# Patient Record
Sex: Female | Born: 1975 | Hispanic: No | Marital: Married | Smoking: Current some day smoker
Health system: Southern US, Community
[De-identification: ages and names within clinical notes are randomized; demographics above are authoritative.]

## PROBLEM LIST (undated history)

## (undated) ENCOUNTER — Ambulatory Visit (HOSPITAL_COMMUNITY): Payer: Managed Care, Other (non HMO) | Source: Home / Self Care

## (undated) DIAGNOSIS — Z8619 Personal history of other infectious and parasitic diseases: Secondary | ICD-10-CM

## (undated) DIAGNOSIS — E282 Polycystic ovarian syndrome: Secondary | ICD-10-CM

## (undated) DIAGNOSIS — I10 Essential (primary) hypertension: Secondary | ICD-10-CM

## (undated) DIAGNOSIS — O09529 Supervision of elderly multigravida, unspecified trimester: Secondary | ICD-10-CM

## (undated) HISTORY — DX: Supervision of elderly multigravida, unspecified trimester: O09.529

## (undated) HISTORY — DX: Personal history of other infectious and parasitic diseases: Z86.19

---

## 2004-12-07 ENCOUNTER — Emergency Department (HOSPITAL_COMMUNITY): Admission: EM | Admit: 2004-12-07 | Discharge: 2004-12-07 | Payer: Self-pay | Admitting: Emergency Medicine

## 2005-05-17 ENCOUNTER — Other Ambulatory Visit: Admission: RE | Admit: 2005-05-17 | Discharge: 2005-05-17 | Payer: Self-pay | Admitting: Family Medicine

## 2005-08-07 ENCOUNTER — Ambulatory Visit (HOSPITAL_COMMUNITY): Admission: RE | Admit: 2005-08-07 | Discharge: 2005-08-07 | Payer: Self-pay | Admitting: Obstetrics and Gynecology

## 2006-07-15 ENCOUNTER — Emergency Department (HOSPITAL_COMMUNITY): Admission: EM | Admit: 2006-07-15 | Discharge: 2006-07-15 | Payer: Self-pay | Admitting: Emergency Medicine

## 2006-07-18 ENCOUNTER — Emergency Department (HOSPITAL_COMMUNITY): Admission: EM | Admit: 2006-07-18 | Discharge: 2006-07-18 | Payer: Self-pay | Admitting: Emergency Medicine

## 2008-01-06 ENCOUNTER — Emergency Department (HOSPITAL_COMMUNITY): Admission: EM | Admit: 2008-01-06 | Discharge: 2008-01-06 | Payer: Self-pay | Admitting: Emergency Medicine

## 2009-02-08 ENCOUNTER — Ambulatory Visit (HOSPITAL_COMMUNITY): Admission: RE | Admit: 2009-02-08 | Discharge: 2009-02-08 | Payer: Self-pay | Admitting: Gynecology

## 2009-09-07 ENCOUNTER — Emergency Department (HOSPITAL_COMMUNITY): Admission: EM | Admit: 2009-09-07 | Discharge: 2009-09-08 | Payer: Self-pay | Admitting: Emergency Medicine

## 2009-11-30 ENCOUNTER — Encounter: Admission: RE | Admit: 2009-11-30 | Discharge: 2010-01-06 | Payer: Self-pay | Admitting: Obstetrics and Gynecology

## 2010-01-31 ENCOUNTER — Ambulatory Visit: Payer: Self-pay | Admitting: Family

## 2010-01-31 ENCOUNTER — Inpatient Hospital Stay (HOSPITAL_COMMUNITY): Admission: AD | Admit: 2010-01-31 | Discharge: 2010-01-31 | Payer: Self-pay | Admitting: Obstetrics and Gynecology

## 2010-02-06 ENCOUNTER — Inpatient Hospital Stay (HOSPITAL_COMMUNITY): Admission: AD | Admit: 2010-02-06 | Discharge: 2010-02-08 | Payer: Self-pay | Admitting: Obstetrics and Gynecology

## 2010-02-08 ENCOUNTER — Encounter: Admission: RE | Admit: 2010-02-08 | Discharge: 2010-02-17 | Payer: Self-pay | Admitting: Obstetrics and Gynecology

## 2010-06-20 LAB — CBC
MCV: 88.4 fL (ref 78.0–100.0)
WBC: 16.9 10*3/uL — ABNORMAL HIGH (ref 4.0–10.5)

## 2010-06-21 LAB — CBC
Hemoglobin: 12.3 g/dL (ref 12.0–15.0)
Hemoglobin: 12.5 g/dL (ref 12.0–15.0)
MCHC: 33.7 g/dL (ref 30.0–36.0)
MCHC: 34.2 g/dL (ref 30.0–36.0)
MCHC: 34.4 g/dL (ref 30.0–36.0)
Platelets: 228 10*3/uL (ref 150–400)
RBC: 4.08 MIL/uL (ref 3.87–5.11)
RDW: 15 % (ref 11.5–15.5)
WBC: 9.3 10*3/uL (ref 4.0–10.5)

## 2010-06-21 LAB — COMPREHENSIVE METABOLIC PANEL
ALT: 18 U/L (ref 0–35)
AST: 19 U/L (ref 0–37)
Alkaline Phosphatase: 96 U/L (ref 39–117)
Alkaline Phosphatase: 98 U/L (ref 39–117)
BUN: 10 mg/dL (ref 6–23)
CO2: 22 mEq/L (ref 19–32)
CO2: 23 mEq/L (ref 19–32)
Calcium: 8.9 mg/dL (ref 8.4–10.5)
Calcium: 8.9 mg/dL (ref 8.4–10.5)
Chloride: 103 mEq/L (ref 96–112)
GFR calc Af Amer: >60 mL/min (ref >60)
Glucose, Bld: 104 mg/dL — ABNORMAL HIGH (ref 70–99)
Potassium: 3.9 mEq/L (ref 3.5–5.1)
Potassium: 4 mEq/L (ref 3.5–5.1)
Sodium: 133 mEq/L — ABNORMAL LOW (ref 135–145)
Total Bilirubin: 0.8 mg/dL (ref 0.3–1.2)
Total Bilirubin: 0.9 mg/dL (ref 0.3–1.2)

## 2010-06-21 LAB — URINALYSIS, ROUTINE W REFLEX MICROSCOPIC
Bilirubin Urine: NEGATIVE
Glucose, UA: NEGATIVE mg/dL
Hgb urine dipstick: NEGATIVE
Ketones, ur: NEGATIVE mg/dL
Protein, ur: NEGATIVE mg/dL
Specific Gravity, Urine: 1.025 (ref 1.005–1.030)
pH: 6 (ref 5.0–8.0)

## 2010-06-21 LAB — GLUCOSE, CAPILLARY: Glucose-Capillary: 76 mg/dL (ref 70–99)

## 2010-06-21 LAB — URIC ACID
Uric Acid, Serum: 5.8 mg/dL (ref 2.4–7.0)
Uric Acid, Serum: 6.1 mg/dL (ref 2.4–7.0)

## 2010-06-26 LAB — POCT PREGNANCY, URINE: Preg Test, Ur: POSITIVE

## 2010-06-26 LAB — URINALYSIS, ROUTINE W REFLEX MICROSCOPIC
Glucose, UA: NEGATIVE mg/dL
Hgb urine dipstick: NEGATIVE
Ketones, ur: 15 mg/dL — AB
Leukocytes, UA: NEGATIVE
Protein, ur: 100 mg/dL — AB
Specific Gravity, Urine: 1.034 — ABNORMAL HIGH (ref 1.005–1.030)
Urobilinogen, UA: 0.2 mg/dL (ref 0.0–1.0)
pH: 6 (ref 5.0–8.0)

## 2010-06-26 LAB — URINE MICROSCOPIC-ADD ON

## 2010-06-26 LAB — CBC
HCT: 34.5 % — ABNORMAL LOW (ref 36.0–46.0)
Hemoglobin: 12.1 g/dL (ref 12.0–15.0)
MCV: 87.9 fL (ref 78.0–100.0)
Platelets: 273 10*3/uL (ref 150–400)
RBC: 3.93 MIL/uL (ref 3.87–5.11)
RDW: 13.4 % (ref 11.5–15.5)
WBC: 7.4 10*3/uL (ref 4.0–10.5)

## 2010-06-26 LAB — BASIC METABOLIC PANEL
GFR calc Af Amer: >60 mL/min (ref >60)
Potassium: 3 mEq/L — ABNORMAL LOW (ref 3.5–5.1)
Sodium: 136 mEq/L (ref 135–145)

## 2010-06-26 LAB — DIFFERENTIAL
Basophils Relative: 0 % (ref 0–1)
Eosinophils Absolute: 0.1 10*3/uL (ref 0.0–0.7)
Monocytes Absolute: 0.4 10*3/uL (ref 0.1–1.0)
Neutrophils Relative %: 77 % (ref 43–77)

## 2010-06-26 LAB — WET PREP, GENITAL
Trich, Wet Prep: NONE SEEN
WBC, Wet Prep HPF POC: NONE SEEN
Yeast Wet Prep HPF POC: NONE SEEN

## 2011-02-20 LAB — OB RESULTS CONSOLE GC/CHLAMYDIA: Chlamydia: NEGATIVE

## 2011-02-20 LAB — OB RESULTS CONSOLE RPR: RPR: NONREACTIVE

## 2011-02-20 LAB — OB RESULTS CONSOLE RUBELLA ANTIBODY, IGM: Rubella: IMMUNE

## 2011-02-20 LAB — OB RESULTS CONSOLE HEPATITIS B SURFACE ANTIGEN: Hepatitis B Surface Ag: NEGATIVE

## 2011-03-28 ENCOUNTER — Encounter: Payer: Self-pay | Admitting: Emergency Medicine

## 2011-03-28 ENCOUNTER — Emergency Department (HOSPITAL_COMMUNITY): Payer: Managed Care, Other (non HMO)

## 2011-03-28 ENCOUNTER — Emergency Department (HOSPITAL_COMMUNITY)
Admission: EM | Admit: 2011-03-28 | Discharge: 2011-03-28 | Disposition: A | Discharge status: Home / Self Care | Payer: Managed Care, Other (non HMO) | Attending: Emergency Medicine | Admitting: Emergency Medicine

## 2011-03-28 DIAGNOSIS — R509 Fever, unspecified: Secondary | ICD-10-CM | POA: Insufficient documentation

## 2011-03-28 DIAGNOSIS — J111 Influenza due to unidentified influenza virus with other respiratory manifestations: Secondary | ICD-10-CM | POA: Insufficient documentation

## 2011-03-28 MED ORDER — ACETAMINOPHEN 325 MG PO TABS
650.0000 mg | ORAL_TABLET | Freq: Once | ORAL | Status: AC
  Administered 2011-03-28: 650 mg via ORAL
  Filled 2011-03-28: qty 2

## 2011-03-28 MED ORDER — OSELTAMIVIR PHOSPHATE 75 MG PO CAPS: 75.0000 mg | ORAL_CAPSULE | Freq: Two times a day (BID) | ORAL | Status: AC

## 2011-03-28 NOTE — ED Notes (Signed)
Alert, NAD, calm, interactive, registration at [facility], xray here to take pt, to xray iin w/c, mentions: HA, some sob, some CP, dizziness & nausea.

## 2011-03-28 NOTE — ED Provider Notes (Signed)
History    35yF with fever and general malaise. Gradual onset about 3d ago. Progressively worsening. Feels achy all over. Mild diffuse HA. No neck stiffness. Subjective fever. No urinary complaints. Occasional productive cough. No sob. Vomiting x2. nbnb emesis. Sick contacts.   CSN: [id number] Arrival date & time: 03/28/2011  2:24 AM   First MD Initiated Contact with Patient 03/28/11 [phone]      Chief Complaint  Patient presents with  . Generalized Body Aches    (Consider location/radiation/quality/duration/timing/severity/associated sxs/prior treatment) HPI  History reviewed. No pertinent past medical history.  History reviewed. No pertinent past surgical history.  History reviewed. No pertinent family history.  History  Substance Use Topics  . Smoking status: Never Smoker   . Smokeless tobacco: Not on file  . Alcohol Use: No    OB History    Grav Para Term Preterm Abortions TAB SAB Ect Mult Living                  Review of Systems   Review of symptoms negative unless otherwise noted in HPI.  Allergies  Review of patient's allergies indicates no known allergies.  Home Medications   Current Outpatient Rx  Name Route Sig Dispense Refill  . PRENATAL 27-0.8 MG PO TABS Oral Take 1 tablet by mouth daily.        BP 112/67  Pulse 121  Temp(Src) 100.1 F (37.8 C) (Oral)  Resp 19  SpO2 95%  LMP 12/24/2010  Physical Exam  Nursing note and vitals reviewed. Constitutional: She appears well-developed and well-nourished.       mildly uncomfortable appearing but not toxic  HENT:  Head: Normocephalic and atraumatic.  Right Ear: External ear normal.  Left Ear: External ear normal.  Mouth/Throat: Oropharynx is clear and moist. No oropharyngeal exudate.  Eyes: Conjunctivae are normal. Pupils are equal, round, and reactive to light. Right eye exhibits no discharge. Left eye exhibits no discharge.  Neck: Normal range of motion. Neck supple.  Cardiovascular: Regular  rhythm and normal heart sounds.  Exam reveals no gallop and no friction rub.   No murmur heard.      mildy tachycardic  Pulmonary/Chest: Effort normal and breath sounds normal. No respiratory distress. She has no wheezes. She has no rales.  Abdominal: Soft. She exhibits no distension. There is no tenderness.  Genitourinary:       No cva tenderness.  Musculoskeletal: She exhibits no edema and no tenderness.  Lymphadenopathy:    She has no cervical adenopathy.  Neurological: She is alert.  Skin: Skin is warm and dry. She is not diaphoretic.  Psychiatric: She has a normal mood and affect. Her behavior is normal. Thought content normal.    ED Course  Procedures (including critical care time)  Labs Reviewed - No data to display No results found.   1. Fever   2. Influenza       MDM  35yF with fever and cough. Consider viral uri, pneumonia, pe. Suspect viral illness, likely flu. Pt nontoxic and no respiratory distress. CXR with no focal infiltrate. Pt otherwise healthy. Symptom onset less than 24 hours ago and also pregnant so will tx with tamiflu. Strict return precautions given. outpt fu.        Raeford Razor, MD 04/05/11 [phone]

## 2011-03-28 NOTE — ED Notes (Signed)
Sx onset 2-3d ago, family sick with same sx.

## 2011-03-28 NOTE — ED Notes (Signed)
EDP in to see pt at d/cgiven Rx x1.

## 2011-03-28 NOTE — ED Notes (Signed)
PT. REPORTS GENERALIZED BODY ACHES  WITH HEADACHE , PRODUCTIVE COUGH AND VOMITTING ONSET YESTERDAY.

## 2011-09-24 ENCOUNTER — Telehealth (HOSPITAL_COMMUNITY): Payer: Self-pay | Admitting: *Deleted

## 2011-09-24 ENCOUNTER — Encounter (HOSPITAL_COMMUNITY): Payer: Self-pay | Admitting: *Deleted

## 2011-09-24 NOTE — Telephone Encounter (Signed)
Preadmission screen  

## 2011-09-27 ENCOUNTER — Inpatient Hospital Stay (HOSPITAL_COMMUNITY)
Admission: RE | Admit: 2011-09-27 | Discharge: 2011-09-29 | DRG: 775 | Disposition: A | Discharge status: Home / Self Care | Payer: Managed Care, Other (non HMO) | Source: Ambulatory Visit | Attending: Obstetrics and Gynecology | Admitting: Obstetrics and Gynecology

## 2011-09-27 ENCOUNTER — Encounter (HOSPITAL_COMMUNITY): Payer: Self-pay | Admitting: Anesthesiology

## 2011-09-27 ENCOUNTER — Inpatient Hospital Stay (HOSPITAL_COMMUNITY): Payer: Managed Care, Other (non HMO) | Admitting: Anesthesiology

## 2011-09-27 ENCOUNTER — Encounter (HOSPITAL_COMMUNITY): Payer: Self-pay

## 2011-09-27 VITALS — BP 113/82 | HR 73 | Temp 97.7°F | Resp 18 | Ht 66.0 in | Wt 242.0 lb

## 2011-09-27 DIAGNOSIS — Z3490 Encounter for supervision of normal pregnancy, unspecified, unspecified trimester: Secondary | ICD-10-CM

## 2011-09-27 DIAGNOSIS — O09529 Supervision of elderly multigravida, unspecified trimester: Secondary | ICD-10-CM | POA: Diagnosis present

## 2011-09-27 LAB — OB RESULTS CONSOLE GBS: GBS: NEGATIVE

## 2011-09-27 LAB — CBC
Hemoglobin: 11.7 g/dL — ABNORMAL LOW (ref 12.0–15.0)
MCHC: 35.2 g/dL (ref 30.0–36.0)
RDW: 15 % (ref 11.5–15.5)

## 2011-09-27 LAB — RPR: RPR Ser Ql: NONREACTIVE

## 2011-09-27 MED ORDER — CITRIC ACID-SODIUM CITRATE 334-500 MG/5ML PO SOLN: 30.0000 mL | ORAL | Status: DC | PRN

## 2011-09-27 MED ORDER — FLEET ENEMA 7-19 GM/118ML RE ENEM: 1.0000 | ENEMA | RECTAL | Status: DC | PRN

## 2011-09-27 MED ORDER — LACTATED RINGERS IV SOLN: 500.0000 mL | INTRAVENOUS | Status: DC | PRN

## 2011-09-27 MED ORDER — ACETAMINOPHEN 325 MG PO TABS: 650.0000 mg | ORAL_TABLET | ORAL | Status: DC | PRN

## 2011-09-27 MED ORDER — IBUPROFEN 600 MG PO TABS
600.0000 mg | ORAL_TABLET | Freq: Four times a day (QID) | ORAL | Status: DC
  Administered 2011-09-27 – 2011-09-29 (×7): 600 mg via ORAL
  Filled 2011-09-27 (×7): qty 1

## 2011-09-27 MED ORDER — LANOLIN HYDROUS EX OINT: TOPICAL_OINTMENT | CUTANEOUS | Status: DC | PRN

## 2011-09-27 MED ORDER — DIPHENHYDRAMINE HCL 25 MG PO CAPS: 25.0000 mg | ORAL_CAPSULE | Freq: Four times a day (QID) | ORAL | Status: DC | PRN

## 2011-09-27 MED ORDER — PRENATAL MULTIVITAMIN CH
1.0000 | ORAL_TABLET | Freq: Every day | ORAL | Status: DC
  Administered 2011-09-28 – 2011-09-29 (×2): 1 via ORAL
  Filled 2011-09-27 (×2): qty 1

## 2011-09-27 MED ORDER — BISACODYL 10 MG RE SUPP: 10.0000 mg | Freq: Every day | RECTAL | Status: DC | PRN

## 2011-09-27 MED ORDER — LIDOCAINE HCL (PF) 1 % IJ SOLN
INTRAMUSCULAR | Status: DC | PRN
  Administered 2011-09-27 (×3): 4 mL

## 2011-09-27 MED ORDER — OXYTOCIN 40 UNITS IN LACTATED RINGERS INFUSION - SIMPLE MED: 62.5000 mL/h | Freq: Once | INTRAVENOUS | Status: DC

## 2011-09-27 MED ORDER — LACTATED RINGERS IV SOLN
INTRAVENOUS | Status: DC
  Administered 2011-09-27 (×3): via INTRAVENOUS

## 2011-09-27 MED ORDER — OXYTOCIN 40 UNITS IN LACTATED RINGERS INFUSION - SIMPLE MED
1.0000 m[IU]/min | INTRAVENOUS | Status: DC
  Administered 2011-09-27: 1 m[IU]/min via INTRAVENOUS
  Filled 2011-09-27: qty 1000

## 2011-09-27 MED ORDER — DIPHENHYDRAMINE HCL 50 MG/ML IJ SOLN: 12.5000 mg | INTRAMUSCULAR | Status: DC | PRN

## 2011-09-27 MED ORDER — ZOLPIDEM TARTRATE 5 MG PO TABS: 5.0000 mg | ORAL_TABLET | Freq: Every evening | ORAL | Status: DC | PRN

## 2011-09-27 MED ORDER — IBUPROFEN 600 MG PO TABS: 600.0000 mg | ORAL_TABLET | Freq: Four times a day (QID) | ORAL | Status: DC | PRN

## 2011-09-27 MED ORDER — OXYCODONE-ACETAMINOPHEN 5-325 MG PO TABS: 1.0000 | ORAL_TABLET | ORAL | Status: DC | PRN

## 2011-09-27 MED ORDER — FLEET ENEMA 7-19 GM/118ML RE ENEM: 1.0000 | ENEMA | Freq: Every day | RECTAL | Status: DC | PRN

## 2011-09-27 MED ORDER — SIMETHICONE 80 MG PO CHEW: 80.0000 mg | CHEWABLE_TABLET | ORAL | Status: DC | PRN

## 2011-09-27 MED ORDER — ONDANSETRON HCL 4 MG/2ML IJ SOLN: 4.0000 mg | Freq: Four times a day (QID) | INTRAMUSCULAR | Status: DC | PRN

## 2011-09-27 MED ORDER — EPHEDRINE 5 MG/ML INJ
10.0000 mg | INTRAVENOUS | Status: DC | PRN
  Filled 2011-09-27: qty 4

## 2011-09-27 MED ORDER — FENTANYL 2.5 MCG/ML BUPIVACAINE 1/10 % EPIDURAL INFUSION (WH - ANES)
14.0000 mL/h | INTRAMUSCULAR | Status: DC
  Administered 2011-09-27: 14 mL/h via EPIDURAL
  Filled 2011-09-27: qty 60

## 2011-09-27 MED ORDER — LACTATED RINGERS IV SOLN: 500.0000 mL | Freq: Once | INTRAVENOUS | Status: DC

## 2011-09-27 MED ORDER — ONDANSETRON HCL 4 MG/2ML IJ SOLN: 4.0000 mg | INTRAMUSCULAR | Status: DC | PRN

## 2011-09-27 MED ORDER — ONDANSETRON HCL 4 MG PO TABS: 4.0000 mg | ORAL_TABLET | ORAL | Status: DC | PRN

## 2011-09-27 MED ORDER — PHENYLEPHRINE 40 MCG/ML (10ML) SYRINGE FOR IV PUSH (FOR BLOOD PRESSURE SUPPORT): 80.0000 ug | PREFILLED_SYRINGE | INTRAVENOUS | Status: DC | PRN

## 2011-09-27 MED ORDER — TETANUS-DIPHTH-ACELL PERTUSSIS 5-2.5-18.5 LF-MCG/0.5 IM SUSP
0.5000 mL | Freq: Once | INTRAMUSCULAR | Status: DC
  Filled 2011-09-27: qty 0.5

## 2011-09-27 MED ORDER — DIBUCAINE 1 % RE OINT: 1.0000 "application " | TOPICAL_OINTMENT | RECTAL | Status: DC | PRN

## 2011-09-27 MED ORDER — TERBUTALINE SULFATE 1 MG/ML IJ SOLN: 0.2500 mg | Freq: Once | INTRAMUSCULAR | Status: DC | PRN

## 2011-09-27 MED ORDER — OXYTOCIN BOLUS FROM INFUSION
250.0000 mL | Freq: Once | INTRAVENOUS | Status: DC
  Filled 2011-09-27: qty 500

## 2011-09-27 MED ORDER — PHENYLEPHRINE 40 MCG/ML (10ML) SYRINGE FOR IV PUSH (FOR BLOOD PRESSURE SUPPORT)
80.0000 ug | PREFILLED_SYRINGE | INTRAVENOUS | Status: DC | PRN
  Filled 2011-09-27: qty 5

## 2011-09-27 MED ORDER — WITCH HAZEL-GLYCERIN EX PADS: 1.0000 "application " | MEDICATED_PAD | CUTANEOUS | Status: DC | PRN

## 2011-09-27 MED ORDER — SENNOSIDES-DOCUSATE SODIUM 8.6-50 MG PO TABS
2.0000 | ORAL_TABLET | Freq: Every day | ORAL | Status: DC
  Administered 2011-09-27: 2 via ORAL

## 2011-09-27 MED ORDER — OXYCODONE-ACETAMINOPHEN 5-325 MG PO TABS
1.0000 | ORAL_TABLET | ORAL | Status: DC | PRN
  Administered 2011-09-28 – 2011-09-29 (×5): 1 via ORAL
  Filled 2011-09-27 (×5): qty 1

## 2011-09-27 MED ORDER — LIDOCAINE HCL (PF) 1 % IJ SOLN
30.0000 mL | INTRAMUSCULAR | Status: DC | PRN
  Filled 2011-09-27: qty 30

## 2011-09-27 MED ORDER — EPHEDRINE 5 MG/ML INJ: 10.0000 mg | INTRAVENOUS | Status: DC | PRN

## 2011-09-27 MED ORDER — BENZOCAINE-MENTHOL 20-0.5 % EX AERO
1.0000 "application " | INHALATION_SPRAY | CUTANEOUS | Status: DC | PRN
  Administered 2011-09-27: 1 via TOPICAL
  Filled 2011-09-27: qty 56

## 2011-09-27 NOTE — Progress Notes (Signed)
Delivery Note Rapid second stage Shoulder dystocia-McRobert's, second degree MLE, suprapubic pressure, less than 1 minute, nuchal cord x 1 reduced, mod traction VMI  Apgars 8/9, weight 8# 15 oz, art pH pending Placenta intact to path, 3 vessels EBL  350cc Patient/infant stable in LDR

## 2011-09-27 NOTE — H&P (Signed)
Rebecca Moody is a 36 y.o. female presenting for induction of labor at term with favorable cervix. No headache, epigastric pain or blurry vision. No ROM/Bleeding. Maternal Medical History:  Fetal activity: Perceived fetal activity is normal.      OB History    Grav Para Term Preterm Abortions TAB SAB Ect Mult Living   3 1 1  1  1   1      Past Medical History  Diagnosis Date  . H/O varicella   . AMA (advanced maternal age) multigravida 35+    No past surgical history on file. Family History: family history includes Diabetes in her father. Social History:  reports that she has never smoked. She has never used smokeless tobacco. She reports that she does not drink alcohol or use illicit drugs.   Prenatal Transfer Tool  Maternal Diabetes: No Genetic Screening: Normal Maternal Ultrasounds/Referrals: Normal Fetal Ultrasounds or other Referrals:  None Maternal Substance Abuse:  No Significant Maternal Medications:  None Significant Maternal Lab Results:  None Other Comments:  None  Review of Systems  Eyes: Negative for blurred vision.  Gastrointestinal: Negative for abdominal pain.  Neurological: Negative for headaches.      Temperature 97.9 F (36.6 C), temperature source Oral, resp. rate 18, last menstrual period 12/24/2010. Maternal Exam:  Uterine Assessment: Contraction strength is moderate.  Contraction frequency is irregular.   Abdomen: Fetal presentation: vertex     Physical Exam  Cardiovascular: Normal rate and regular rhythm.   Respiratory: Effort normal and breath sounds normal.  GI: There is no tenderness.  Neurological: She has normal reflexes.   Cx-4/80/-2/vertex AROM  Thin mec FHT reactive UCs irregular Prenatal labs: ABO, Rh: O/Positive/-- (11/13 0000) Antibody: Negative (11/13 0000) Rubella: Immune (11/13 0000) RPR: Nonreactive (11/13 0000)  HBsAg: Negative (11/13 0000)  HIV: Non-reactive (11/13 0000)  GBS:     Assessment/Plan: 36 yo G3P1  at term with favorable cervix. Induction and risks reviewed with patient.   [name],Foday Cone E 09/27/2011, 7:31 AM

## 2011-09-27 NOTE — Progress Notes (Signed)
GBBS negative

## 2011-09-27 NOTE — Anesthesia Preprocedure Evaluation (Signed)
Anesthesia Evaluation  Patient identified by MRN, date of birth, ID band Patient awake    Reviewed: Allergy & Precautions, H&P , NPO status , Patient's Chart, lab work & pertinent test results, reviewed documented beta blocker date and time   History of Anesthesia Complications Negative for: history of anesthetic complications  Airway Mallampati: I TM Distance: >3 FB Neck ROM: full    Dental  (+) Teeth Intact   Pulmonary neg pulmonary ROS,  breath sounds clear to auscultation        Cardiovascular negative cardio ROS  Rhythm:regular Rate:Normal     Neuro/Psych negative neurological ROS  negative psych ROS   GI/Hepatic negative GI ROS, Neg liver ROS,   Endo/Other  Morbid obesity  Renal/GU negative Renal ROS     Musculoskeletal   Abdominal   Peds  Hematology negative hematology ROS (+)   Anesthesia Other Findings   Reproductive/Obstetrics (+) Pregnancy                           Anesthesia Physical Anesthesia Plan  ASA: II  Anesthesia Plan: Epidural   Post-op Pain Management:    Induction:   Airway Management Planned:   Additional Equipment:   Intra-op Plan:   Post-operative Plan:   Informed Consent: I have reviewed the patients History and Physical, chart, labs and discussed the procedure including the risks, benefits and alternatives for the proposed anesthesia with the patient or authorized representative who has indicated his/her understanding and acceptance.     Plan Discussed with:   Anesthesia Plan Comments:         Anesthesia Quick Evaluation

## 2011-09-27 NOTE — Anesthesia Procedure Notes (Signed)
Epidural Patient location during procedure: OB Start time: 09/27/2011 11:13 AM Reason for block: procedure for pain  Staffing Performed by: anesthesiologist   Preanesthetic Checklist Completed: patient identified, site marked, surgical consent, pre-op evaluation, timeout performed, IV checked, risks and benefits discussed and monitors and equipment checked  Epidural Patient position: sitting Prep: site prepped and draped and DuraPrep Patient monitoring: continuous pulse ox and blood pressure Approach: midline Injection technique: LOR air  Needle:  Needle type: Tuohy  Needle gauge: 17 G Needle length: 9 cm Needle insertion depth: 6 cm Catheter type: closed end flexible Catheter size: 19 Gauge Catheter at skin depth: 11 cm Test dose: negative  Assessment Events: blood not aspirated, injection not painful, no injection resistance, negative IV test and no paresthesia  Additional Notes Discussed risk of headache, infection, bleeding, nerve injury and failed or incomplete block.  Patient voices understanding and wishes to proceed.

## 2011-09-27 NOTE — Progress Notes (Signed)
Cx 5-6/C/-2 FHT reactive UCs q2-3 min IUPC placed

## 2011-09-28 LAB — CBC
Hemoglobin: 9.9 g/dL — ABNORMAL LOW (ref 12.0–15.0)
MCH: 28.5 pg (ref 26.0–34.0)
RBC: 3.47 MIL/uL — ABNORMAL LOW (ref 3.87–5.11)

## 2011-09-28 NOTE — Anesthesia Postprocedure Evaluation (Signed)
  Anesthesia Post-op Note  Patient: Rebecca Moody  Procedure(s) Performed: * No procedures listed *  Patient Location: Mother/Baby  Anesthesia Type: Epidural  Level of Consciousness: awake  Airway and Oxygen Therapy: Patient Spontanous Breathing  Post-op Pain: none  Post-op Assessment: Patient's Cardiovascular Status Stable, Respiratory Function Stable, Patent Airway, No signs of Nausea or vomiting, Adequate PO intake, Pain level controlled, No headache, No backache, No residual numbness and No residual motor weakness  Post-op Vital Signs: Reviewed and stable  Complications: No apparent anesthesia complications

## 2011-09-28 NOTE — Anesthesia Postprocedure Evaluation (Signed)
  Anesthesia Post-op Note  Patient: Rebecca Moody  Procedure(s) Performed: * No procedures listed *  Patient Location: Mother/Baby  Anesthesia Type: Epidural  Level of Consciousness: awake  Airway and Oxygen Therapy: Patient Spontanous Breathing  Post-op Pain: none  Post-op Assessment: Patient's Cardiovascular Status Stable, Respiratory Function Stable, Patent Airway, No signs of Nausea or vomiting, Adequate PO intake, Pain level controlled, No headache, No backache, No residual numbness and No residual motor weakness  Post-op Vital Signs: Reviewed and stable  Complications: No apparent anesthesia complications 

## 2011-09-28 NOTE — Progress Notes (Signed)
Post Partum Day 1 Subjective: no complaints, up ad lib, voiding and tolerating PO. Desires circ  Objective: Blood pressure 105/70, pulse 94, temperature 98.3 F (36.8 C), temperature source Oral, resp. rate 18, height 5\' 6"  (1.676 m), weight 109.77 kg (242 lb), last menstrual period 12/24/2010, SpO2 100.00%, unknown if currently breastfeeding.  Physical Exam:  General: alert and cooperative Lochia: appropriate Uterine Fundus: firm Incision: perineum intact DVT Evaluation: No evidence of DVT seen on physical exam.   Basename 09/28/11 0525 09/27/11 0741  HGB 9.9* 11.7*  HCT 28.5* 33.2*    Assessment/Plan: Plan for discharge tomorrow   LOS: 1 day   Tachina Spoonemore G 09/28/2011, 8:16 AM

## 2011-09-29 MED ORDER — OXYCODONE-ACETAMINOPHEN 5-325 MG PO TABS: 1.0000 | ORAL_TABLET | ORAL | Status: AC | PRN

## 2011-09-29 MED ORDER — IBUPROFEN 600 MG PO TABS: 600.0000 mg | ORAL_TABLET | Freq: Four times a day (QID) | ORAL | Status: AC

## 2011-09-29 NOTE — Discharge Summary (Signed)
Obstetric Discharge Summary Reason for Admission: induction of labor Prenatal Procedures: none Intrapartum Procedures: spontaneous vaginal delivery Postpartum Procedures: none Complications-Operative and Postpartum: none Hemoglobin  Date Value Range Status  09/28/2011 9.9* 12.0 - 15.0 g/dL Final     HCT  Date Value Range Status  09/28/2011 28.5* 36.0 - 46.0 % Final    Physical Exam:  General: alert, cooperative and appears stated age 36: appropriate Uterine Fundus: firm Incision: healing well DVT Evaluation: No evidence of DVT seen on physical exam.  Discharge Diagnoses: Term Pregnancy-delivered  Discharge Information: Date: 09/29/2011 Activity: pelvic rest Diet: routine Medications: Ibuprofen and Percocet Condition: stable Instructions: refer to practice specific booklet Discharge to: home   Newborn Data: Live born female  Birth Weight: 8 lb 15 oz (4054 g) APGAR: 8, 9  Home with mother.  Yaneisy Wenz L 09/29/2011, 9:07 AM

## 2011-09-29 NOTE — Progress Notes (Signed)
Post Partum Day 2 Subjective: no complaints, up ad lib, voiding, tolerating PO and + flatus  Objective: Blood pressure 113/82, pulse 73, temperature 97.7 F (36.5 C), temperature source Oral, resp. rate 18, height 5\' 6"  (1.676 m), weight 109.77 kg (242 lb), last menstrual period 12/24/2010, SpO2 100.00%, unknown if currently breastfeeding.  Physical Exam:  General: alert, cooperative and appears stated age Lochia: appropriate Uterine Fundus: firm Incision: healing well, no significant drainage, no dehiscence, no significant erythema DVT Evaluation: No evidence of DVT seen on physical exam.   Basename 09/28/11 0525 09/27/11 0741  HGB 9.9* 11.7*  HCT 28.5* 33.2*    Assessment/Plan: Discharge home Ibuprofen and percocet Follow up in 6 weeks   LOS: 2 days   Hawk Mones L 09/29/2011, 9:06 AM

## 2013-07-17 ENCOUNTER — Emergency Department (INDEPENDENT_AMBULATORY_CARE_PROVIDER_SITE_OTHER): Payer: Managed Care, Other (non HMO)

## 2013-07-17 ENCOUNTER — Emergency Department (INDEPENDENT_AMBULATORY_CARE_PROVIDER_SITE_OTHER)
Admission: EM | Admit: 2013-07-17 | Discharge: 2013-07-17 | Disposition: A | Discharge status: Home / Self Care | Payer: Managed Care, Other (non HMO) | Source: Home / Self Care

## 2013-07-17 ENCOUNTER — Encounter (HOSPITAL_COMMUNITY): Payer: Self-pay | Admitting: Emergency Medicine

## 2013-07-17 DIAGNOSIS — X58XXXA Exposure to other specified factors, initial encounter: Secondary | ICD-10-CM

## 2013-07-17 DIAGNOSIS — S39012A Strain of muscle, fascia and tendon of lower back, initial encounter: Secondary | ICD-10-CM

## 2013-07-17 DIAGNOSIS — S335XXA Sprain of ligaments of lumbar spine, initial encounter: Secondary | ICD-10-CM

## 2013-07-17 LAB — POCT URINALYSIS DIP (DEVICE)
Bilirubin Urine: NEGATIVE
Glucose, UA: NEGATIVE mg/dL
Ketones, ur: NEGATIVE mg/dL
LEUKOCYTES UA: NEGATIVE
NITRITE: NEGATIVE
PH: 6 (ref 5.0–8.0)
PROTEIN: NEGATIVE mg/dL
Specific Gravity, Urine: >=1.03 (ref 1.005–1.030)
Urobilinogen, UA: 0.2 mg/dL (ref 0.0–1.0)

## 2013-07-17 LAB — POCT PREGNANCY, URINE: PREG TEST UR: NEGATIVE

## 2013-07-17 MED ORDER — CYCLOBENZAPRINE HCL 5 MG PO TABS: 5.0000 mg | ORAL_TABLET | Freq: Three times a day (TID) | ORAL | Status: DC | PRN

## 2013-07-17 MED ORDER — IBUPROFEN 800 MG PO TABS: 800.0000 mg | ORAL_TABLET | Freq: Three times a day (TID) | ORAL | Status: DC | PRN

## 2013-07-17 NOTE — ED Notes (Signed)
Pt c/o back pain and lower abd pain onset yest Pain is getting worse... Increases w/activity Denies urinary sxs, gyn sxs, f/v/n/d, inj/trauma States that yest she felt nauseas, dizzy and felt like "passing out" Denies syncope Alert w/no signs of acute distress.

## 2013-07-17 NOTE — Discharge Instructions (Signed)
Heat, medications and avoid prolonged sitting or bending over weekend, see orthopedist if further problems.

## 2013-07-17 NOTE — ED Provider Notes (Signed)
CSN: [id number]     Arrival date & time 07/17/13  0913 History   None    Chief Complaint  Patient presents with  . Back Pain   (Consider location/radiation/quality/duration/timing/severity/associated sxs/prior Treatment) Patient is a 38 y.o. female presenting with back pain. The history is provided by the patient.  Back Pain Location:  Lumbar spine Quality:  Shooting and stabbing Radiates to:  Does not radiate Pain severity:  Moderate Onset quality:  Gradual Duration:  1 day Progression:  Worsening Chronicity:  New Context comment:  Does sitting all day on job at [employer]. Worsened by:  Sitting and bending Associated symptoms: pelvic pain   Associated symptoms: no abdominal pain, no bladder incontinence, no bowel incontinence, no dysuria, no fever, no numbness, no paresthesias, no perianal numbness and no tingling   Associated symptoms comment:  Similar to pains during end of preg. Risk factors: obesity   Risk factors: no lack of exercise and not pregnant     Past Medical History  Diagnosis Date  . H/O varicella   . AMA (advanced maternal age) multigravida 35+    History reviewed. No pertinent past surgical history. Family History  Problem Relation Age of Onset  . Diabetes Father    History  Substance Use Topics  . Smoking status: Never Smoker   . Smokeless tobacco: Never Used  . Alcohol Use: No   OB History   Grav Para Term Preterm Abortions TAB SAB Ect Mult Living   3 2 2  0 1 0 1 0 0 2     Review of Systems  Constitutional: Negative for fever.  Gastrointestinal: Negative.  Negative for abdominal pain and bowel incontinence.  Genitourinary: Positive for pelvic pain. Negative for bladder incontinence and dysuria.  Musculoskeletal: Positive for back pain. Negative for gait problem and joint swelling.  Neurological: Negative for tingling, numbness and paresthesias.    Allergies  Review of patient's allergies indicates no known allergies.  Home Medications    Current Outpatient Rx  Name  Route  Sig  Dispense  Refill  . cyclobenzaprine (FLEXERIL) 5 MG tablet   Oral   Take 1 tablet (5 mg total) by mouth 3 (three) times daily as needed for muscle spasms.   30 tablet   0   . ibuprofen (ADVIL,MOTRIN) 800 MG tablet   Oral   Take 1 tablet (800 mg total) by mouth every 8 (eight) hours as needed. For back pain   30 tablet   0   . Prenatal Vit-Fe Fumarate-FA (PRENATAL MULTIVITAMIN) TABS   Oral   Take 1 tablet by mouth at bedtime.          BP 161/98  Pulse 72  Temp(Src) 98 F (36.7 C) (Oral)  Resp 18  SpO2 96%  LMP 07/01/2013  Breastfeeding? No Physical Exam  Nursing note and vitals reviewed. Constitutional: She is oriented to person, place, and time. She appears well-developed and well-nourished.  Abdominal: Soft. Bowel sounds are normal. She exhibits no mass. There is no tenderness.  Musculoskeletal: She exhibits tenderness.       Lumbar back: She exhibits decreased range of motion, tenderness, pain and spasm. She exhibits normal pulse.  Neurological: She is alert and oriented to person, place, and time.  Skin: Skin is warm and dry.    ED Course  Procedures (including critical care time) Labs Review Labs Reviewed  POCT URINALYSIS DIP (DEVICE) - Abnormal; Notable for the following:    Hgb urine dipstick SMALL (*)    All other  components within normal limits  POCT PREGNANCY, URINE   Imaging Review No results found. X-rays reviewed and report per radiologist.   MDM   1. Low back strain        Linna Hoff, MD 07/17/13 1143

## 2013-11-30 ENCOUNTER — Other Ambulatory Visit (HOSPITAL_COMMUNITY)
Admission: RE | Admit: 2013-11-30 | Discharge: 2013-11-30 | Disposition: A | Discharge status: Home / Self Care | Payer: Managed Care, Other (non HMO) | Source: Ambulatory Visit | Attending: Obstetrics & Gynecology | Admitting: Obstetrics & Gynecology

## 2013-11-30 ENCOUNTER — Other Ambulatory Visit: Payer: Self-pay | Admitting: Obstetrics & Gynecology

## 2013-11-30 DIAGNOSIS — Z01419 Encounter for gynecological examination (general) (routine) without abnormal findings: Secondary | ICD-10-CM | POA: Insufficient documentation

## 2013-11-30 DIAGNOSIS — Z1151 Encounter for screening for human papillomavirus (HPV): Secondary | ICD-10-CM | POA: Diagnosis present

## 2013-12-01 LAB — CYTOLOGY - PAP

## 2013-12-23 ENCOUNTER — Other Ambulatory Visit: Payer: Self-pay

## 2013-12-23 ENCOUNTER — Ambulatory Visit (INDEPENDENT_AMBULATORY_CARE_PROVIDER_SITE_OTHER): Payer: Private Health Insurance - Indemnity | Admitting: Emergency Medicine

## 2013-12-23 ENCOUNTER — Ambulatory Visit
Admission: RE | Admit: 2013-12-23 | Discharge: 2013-12-23 | Disposition: A | Discharge status: Home / Self Care | Payer: Managed Care, Other (non HMO) | Source: Ambulatory Visit | Attending: Emergency Medicine | Admitting: Emergency Medicine

## 2013-12-23 VITALS — BP 160/100 | HR 73 | Temp 97.7°F | Resp 18 | Ht 66.5 in | Wt 235.0 lb

## 2013-12-23 DIAGNOSIS — I10 Essential (primary) hypertension: Secondary | ICD-10-CM

## 2013-12-23 DIAGNOSIS — R51 Headache: Secondary | ICD-10-CM

## 2013-12-23 MED ORDER — METOPROLOL TARTRATE 50 MG PO TABS: ORAL_TABLET | ORAL | Status: DC

## 2013-12-23 MED ORDER — HYDROCHLOROTHIAZIDE 12.5 MG PO CAPS: 12.5000 mg | ORAL_CAPSULE | Freq: Every day | ORAL | Status: DC

## 2013-12-23 NOTE — Progress Notes (Signed)
   Subjective:    Patient ID: Rebecca Moody, female    DOB: [birth year], 38 y.o.   MRN: [id number]  HPI  38 y.o. Female presents to clinic today with concerns about hypertension. Had a procedure done at GYN on Monday to have IUD inserted Dwan Bolt). Reports that prior to procedure that BP was high, and after it was still high. States that she had a headache since Monday. States that she took BP at [facility] today and it was also running high; 165/95. Reports having no trouble during pregnancy; reports that sister had hypertension. Mother died of aneurysm, father past due to brain bleed during dialysis.  States that she has gained weight recently, reports that she lost a lot after pregnancy and has since gained it back.  Patient is currently as full time student, mother of 2 and works full time as a Diplomatic Services operational officer at the [employer]. Reports that she is working on getting guardianship of her sister.  Reports that pressure was running approximately 160/90 in GYN office.   In office now: Right Arm 180/110                       Left Arm 182/110  Review of Systems     Objective:   Physical Exam  Constitutional: She is oriented to person, place, and time. She appears well-developed and well-nourished.  HENT:  Head: Normocephalic and atraumatic.  Eyes: Pupils are equal, round, and reactive to light.  Neck: Normal range of motion. Neck supple. No thyromegaly present.  Cardiovascular: Normal rate, regular rhythm and normal heart sounds.  Exam reveals no gallop.   No murmur heard. Pulmonary/Chest: Effort normal and breath sounds normal.  Abdominal: Soft. She exhibits no distension. There is no tenderness.  Neurological: She is alert and oriented to person, place, and time. She has normal reflexes. No cranial nerve deficit.  Psychiatric: She has a normal mood and affect. Her behavior is normal. Judgment and thought content normal.   Meds ordered this encounter  Medications  . ASPIRIN PO    Sig: Take by mouth  as needed.  . metoprolol (LOPRESSOR) 50 MG tablet    Sig: Take one half tablet twice a day    Dispense:  60 tablet    Refill:  11  . hydrochlorothiazide (MICROZIDE) 12.5 MG capsule    Sig: Take 1 capsule (12.5 mg total) by mouth daily.    Dispense:  30 capsule    Refill:  11         Assessment & Plan:  She has no focal neurological signs. We'll check CT head and start HCTZ 12.5 one a day and Lopressor 50 mg half tablet twice a day. She will not take aspirin and only take Tylenol for her headache

## 2013-12-23 NOTE — Patient Instructions (Signed)

## 2013-12-23 NOTE — Telephone Encounter (Signed)
Opened in error

## 2015-02-10 ENCOUNTER — Other Ambulatory Visit: Payer: Self-pay | Admitting: Emergency Medicine

## 2015-03-12 ENCOUNTER — Other Ambulatory Visit: Payer: Self-pay | Admitting: Emergency Medicine

## 2015-03-24 ENCOUNTER — Encounter (HOSPITAL_COMMUNITY): Payer: Self-pay | Admitting: Neurology

## 2015-03-24 ENCOUNTER — Emergency Department (HOSPITAL_COMMUNITY)
Admission: EM | Admit: 2015-03-24 | Discharge: 2015-03-24 | Disposition: A | Discharge status: Home / Self Care | Payer: Managed Care, Other (non HMO) | Attending: Emergency Medicine | Admitting: Emergency Medicine

## 2015-03-24 DIAGNOSIS — M5432 Sciatica, left side: Secondary | ICD-10-CM | POA: Insufficient documentation

## 2015-03-24 DIAGNOSIS — Z79899 Other long term (current) drug therapy: Secondary | ICD-10-CM | POA: Diagnosis not present

## 2015-03-24 DIAGNOSIS — M545 Low back pain: Secondary | ICD-10-CM | POA: Diagnosis present

## 2015-03-24 DIAGNOSIS — R110 Nausea: Secondary | ICD-10-CM | POA: Insufficient documentation

## 2015-03-24 DIAGNOSIS — Z7982 Long term (current) use of aspirin: Secondary | ICD-10-CM | POA: Insufficient documentation

## 2015-03-24 DIAGNOSIS — I10 Essential (primary) hypertension: Secondary | ICD-10-CM | POA: Diagnosis not present

## 2015-03-24 DIAGNOSIS — Z8619 Personal history of other infectious and parasitic diseases: Secondary | ICD-10-CM | POA: Diagnosis not present

## 2015-03-24 HISTORY — DX: Essential (primary) hypertension: I10

## 2015-03-24 MED ORDER — OXYCODONE-ACETAMINOPHEN 5-325 MG PO TABS: 1.0000 | ORAL_TABLET | Freq: Four times a day (QID) | ORAL | Status: DC | PRN

## 2015-03-24 MED ORDER — OXYCODONE-ACETAMINOPHEN 5-325 MG PO TABS
2.0000 | ORAL_TABLET | Freq: Once | ORAL | Status: AC
  Administered 2015-03-24: 2 via ORAL
  Filled 2015-03-24: qty 2

## 2015-03-24 MED ORDER — PREDNISONE 20 MG PO TABS: 40.0000 mg | ORAL_TABLET | Freq: Every day | ORAL | Status: DC

## 2015-03-24 NOTE — Discharge Instructions (Signed)
Sciatica With Rehab The sciatic nerve runs from the back down the leg and is responsible for sensation and control of the muscles in the back (posterior) side of the thigh, lower leg, and foot. Sciatica is a condition that is characterized by inflammation of this nerve.  SYMPTOMS   Signs of nerve damage, including numbness and/or weakness along the posterior side of the lower extremity.  Pain in the back of the thigh that may also travel down the leg.  Pain that worsens when sitting for long periods of time.  Occasionally, pain in the back or buttock. CAUSES  Inflammation of the sciatic nerve is the cause of sciatica. The inflammation is due to something irritating the nerve. Common sources of irritation include:  Sitting for long periods of time.  Direct trauma to the nerve.  Arthritis of the spine.  Herniated or ruptured disk.  Slipping of the vertebrae (spondylolisthesis).  Pressure from soft tissues, such as muscles or ligament-like tissue (fascia). RISK INCREASES WITH:  Sports that place pressure or stress on the spine (football or weightlifting).  Poor strength and flexibility.  Failure to warm up properly before activity.  Family history of low back pain or disk disorders.  Previous back injury or surgery.  Poor body mechanics, especially when lifting, or poor posture. PREVENTION   Warm up and stretch properly before activity.  Maintain physical fitness:  Strength, flexibility, and endurance.  Cardiovascular fitness.  Learn and use proper technique, especially with posture and lifting. When possible, have coach correct improper technique.  Avoid activities that place stress on the spine. PROGNOSIS If treated properly, then sciatica usually resolves within 6 weeks. However, occasionally surgery is necessary.  RELATED COMPLICATIONS   Permanent nerve damage, including pain, numbness, tingle, or weakness.  Chronic back pain.  Risks of surgery: infection,  bleeding, nerve damage, or damage to surrounding tissues. TREATMENT Treatment initially involves resting from any activities that aggravate your symptoms. The use of ice and medication may help reduce pain and inflammation. The use of strengthening and stretching exercises may help reduce pain with activity. These exercises may be performed at home or with referral to a therapist. A therapist may recommend further treatments, such as transcutaneous electronic nerve stimulation (TENS) or ultrasound. Your caregiver may recommend corticosteroid injections to help reduce inflammation of the sciatic nerve. If symptoms persist despite non-surgical (conservative) treatment, then surgery may be recommended. MEDICATION  If pain medication is necessary, then nonsteroidal anti-inflammatory medications, such as aspirin and ibuprofen, or other minor pain relievers, such as acetaminophen, are often recommended.  Do not take pain medication for 7 days before surgery.  Prescription pain relievers may be given if deemed necessary by your caregiver. Use only as directed and only as much as you need.  Ointments applied to the skin may be helpful.  Corticosteroid injections may be given by your caregiver. These injections should be reserved for the most serious cases, because they may only be given a certain number of times. HEAT AND COLD  Cold treatment (icing) relieves pain and reduces inflammation. Cold treatment should be applied for 10 to 15 minutes every 2 to 3 hours for inflammation and pain and immediately after any activity that aggravates your symptoms. Use ice packs or massage the area with a piece of ice (ice massage).  Heat treatment may be used prior to performing the stretching and strengthening activities prescribed by your caregiver, physical therapist, or athletic trainer. Use a heat pack or soak the injury in warm water.   SEEK MEDICAL CARE IF:  Treatment seems to offer no benefit, or the condition  worsens.  Any medications produce adverse side effects. EXERCISES  RANGE OF MOTION (ROM) AND STRETCHING EXERCISES - Sciatica Most people with sciatic will find that their symptoms worsen with either excessive bending forward (flexion) or arching at the low back (extension). The exercises which will help resolve your symptoms will focus on the opposite motion. Your physician, physical therapist or athletic trainer will help you determine which exercises will be most helpful to resolve your low back pain. Do not complete any exercises without first consulting with your clinician. Discontinue any exercises which worsen your symptoms until you speak to your clinician. If you have pain, numbness or tingling which travels down into your buttocks, leg or foot, the goal of the therapy is for these symptoms to move closer to your back and eventually resolve. Occasionally, these leg symptoms will get better, but your low back pain may worsen; this is typically an indication of progress in your rehabilitation. Be certain to be very alert to any changes in your symptoms and the activities in which you participated in the 24 hours prior to the change. Sharing this information with your clinician will allow him/her to most efficiently treat your condition. These exercises may help you when beginning to rehabilitate your injury. Your symptoms may resolve with or without further involvement from your physician, physical therapist or athletic trainer. While completing these exercises, remember:   Restoring tissue flexibility helps normal motion to return to the joints. This allows healthier, less painful movement and activity.  An effective stretch should be held for at least 30 seconds.  A stretch should never be painful. You should only feel a gentle lengthening or release in the stretched tissue. FLEXION RANGE OF MOTION AND STRETCHING EXERCISES: STRETCH - Flexion, Single Knee to Chest   Lie on a firm bed or floor  with both legs extended in front of you.  Keeping one leg in contact with the floor, bring your opposite knee to your chest. Hold your leg in place by either grabbing behind your thigh or at your knee.  Pull until you feel a gentle stretch in your low back. Hold __________ seconds.  Slowly release your grasp and repeat the exercise with the opposite side. Repeat __________ times. Complete this exercise __________ times per day.  STRETCH - Flexion, Double Knee to Chest  Lie on a firm bed or floor with both legs extended in front of you.  Keeping one leg in contact with the floor, bring your opposite knee to your chest.  Tense your stomach muscles to support your back and then lift your other knee to your chest. Hold your legs in place by either grabbing behind your thighs or at your knees.  Pull both knees toward your chest until you feel a gentle stretch in your low back. Hold __________ seconds.  Tense your stomach muscles and slowly return one leg at a time to the floor. Repeat __________ times. Complete this exercise __________ times per day.  STRETCH - Low Trunk Rotation   Lie on a firm bed or floor. Keeping your legs in front of you, bend your knees so they are both pointed toward the ceiling and your feet are flat on the floor.  Extend your arms out to the side. This will stabilize your upper body by keeping your shoulders in contact with the floor.  Gently and slowly drop both knees together to one side until   you feel a gentle stretch in your low back. Hold for __________ seconds.  Tense your stomach muscles to support your low back as you bring your knees back to the starting position. Repeat the exercise to the other side. Repeat __________ times. Complete this exercise __________ times per day  EXTENSION RANGE OF MOTION AND FLEXIBILITY EXERCISES: STRETCH - Extension, Prone on Elbows  Lie on your stomach on the floor, a bed will be too soft. Place your palms about shoulder  width apart and at the height of your head.  Place your elbows under your shoulders. If this is too painful, stack pillows under your chest.  Allow your body to relax so that your hips drop lower and make contact more completely with the floor.  Hold this position for __________ seconds.  Slowly return to lying flat on the floor. Repeat __________ times. Complete this exercise __________ times per day.  RANGE OF MOTION - Extension, Prone Press Ups  Lie on your stomach on the floor, a bed will be too soft. Place your palms about shoulder width apart and at the height of your head.  Keeping your back as relaxed as possible, slowly straighten your elbows while keeping your hips on the floor. You may adjust the placement of your hands to maximize your comfort. As you gain motion, your hands will come more underneath your shoulders.  Hold this position __________ seconds.  Slowly return to lying flat on the floor. Repeat __________ times. Complete this exercise __________ times per day.  STRENGTHENING EXERCISES - Sciatica  These exercises may help you when beginning to rehabilitate your injury. These exercises should be done near your "sweet spot." This is the neutral, low-back arch, somewhere between fully rounded and fully arched, that is your least painful position. When performed in this safe range of motion, these exercises can be used for people who have either a flexion or extension based injury. These exercises may resolve your symptoms with or without further involvement from your physician, physical therapist or athletic trainer. While completing these exercises, remember:   Muscles can gain both the endurance and the strength needed for everyday activities through controlled exercises.  Complete these exercises as instructed by your physician, physical therapist or athletic trainer. Progress with the resistance and repetition exercises only as your caregiver advises.  You may  experience muscle soreness or fatigue, but the pain or discomfort you are trying to eliminate should never worsen during these exercises. If this pain does worsen, stop and make certain you are following the directions exactly. If the pain is still present after adjustments, discontinue the exercise until you can discuss the trouble with your clinician. STRENGTHENING - Deep Abdominals, Pelvic Tilt   Lie on a firm bed or floor. Keeping your legs in front of you, bend your knees so they are both pointed toward the ceiling and your feet are flat on the floor.  Tense your lower abdominal muscles to press your low back into the floor. This motion will rotate your pelvis so that your tail bone is scooping upwards rather than pointing at your feet or into the floor.  With a gentle tension and even breathing, hold this position for __________ seconds. Repeat __________ times. Complete this exercise __________ times per day.  STRENGTHENING - Abdominals, Crunches   Lie on a firm bed or floor. Keeping your legs in front of you, bend your knees so they are both pointed toward the ceiling and your feet are flat on the   floor. Cross your arms over your chest.  Slightly tip your chin down without bending your neck.  Tense your abdominals and slowly lift your trunk high enough to just clear your shoulder blades. Lifting higher can put excessive stress on the low back and does not further strengthen your abdominal muscles.  Control your return to the starting position. Repeat __________ times. Complete this exercise __________ times per day.  STRENGTHENING - Quadruped, Opposite UE/LE Lift  Assume a hands and knees position on a firm surface. Keep your hands under your shoulders and your knees under your hips. You may place padding under your knees for comfort.  Find your neutral spine and gently tense your abdominal muscles so that you can maintain this position. Your shoulders and hips should form a rectangle  that is parallel with the floor and is not twisted.  Keeping your trunk steady, lift your right hand no higher than your shoulder and then your left leg no higher than your hip. Make sure you are not holding your breath. Hold this position __________ seconds.  Continuing to keep your abdominal muscles tense and your back steady, slowly return to your starting position. Repeat with the opposite arm and leg. Repeat __________ times. Complete this exercise __________ times per day.  STRENGTHENING - Abdominals and Quadriceps, Straight Leg Raise   Lie on a firm bed or floor with both legs extended in front of you.  Keeping one leg in contact with the floor, bend the other knee so that your foot can rest flat on the floor.  Find your neutral spine, and tense your abdominal muscles to maintain your spinal position throughout the exercise.  Slowly lift your straight leg off the floor about 6 inches for a count of 15, making sure to not hold your breath.  Still keeping your neutral spine, slowly lower your leg all the way to the floor. Repeat this exercise with each leg __________ times. Complete this exercise __________ times per day. POSTURE AND BODY MECHANICS CONSIDERATIONS - Sciatica Keeping correct posture when sitting, standing or completing your activities will reduce the stress put on different body tissues, allowing injured tissues a chance to heal and limiting painful experiences. The following are general guidelines for improved posture. Your physician or physical therapist will provide you with any instructions specific to your needs. While reading these guidelines, remember:  The exercises prescribed by your provider will help you have the flexibility and strength to maintain correct postures.  The correct posture provides the optimal environment for your joints to work. All of your joints have less wear and tear when properly supported by a spine with good posture. This means you will  experience a healthier, less painful body.  Correct posture must be practiced with all of your activities, especially prolonged sitting and standing. Correct posture is as important when doing repetitive low-stress activities (typing) as it is when doing a single heavy-load activity (lifting). RESTING POSITIONS Consider which positions are most painful for you when choosing a resting position. If you have pain with flexion-based activities (sitting, bending, stooping, squatting), choose a position that allows you to rest in a less flexed posture. You would want to avoid curling into a fetal position on your side. If your pain worsens with extension-based activities (prolonged standing, working overhead), avoid resting in an extended position such as sleeping on your stomach. Most people will find more comfort when they rest with their spine in a more neutral position, neither too rounded nor too   arched. Lying on a non-sagging bed on your side with a pillow between your knees, or on your back with a pillow under your knees will often provide some relief. Keep in mind, being in any one position for a prolonged period of time, no matter how correct your posture, can still lead to stiffness. PROPER SITTING POSTURE In order to minimize stress and discomfort on your spine, you must sit with correct posture Sitting with good posture should be effortless for a healthy body. Returning to good posture is a gradual process. Many people can work toward this most comfortably by using various supports until they have the flexibility and strength to maintain this posture on their own. When sitting with proper posture, your ears will fall over your shoulders and your shoulders will fall over your hips. You should use the back of the chair to support your upper back. Your low back will be in a neutral position, just slightly arched. You may place a small pillow or folded towel at the base of your low back for support.  When  working at a desk, create an environment that supports good, upright posture. Without extra support, muscles fatigue and lead to excessive strain on joints and other tissues. Keep these recommendations in mind: CHAIR:   A chair should be able to slide under your desk when your back makes contact with the back of the chair. This allows you to work closely.  The chair's height should allow your eyes to be level with the upper part of your monitor and your hands to be slightly lower than your elbows. BODY POSITION  Your feet should make contact with the floor. If this is not possible, use a foot rest.  Keep your ears over your shoulders. This will reduce stress on your neck and low back. INCORRECT SITTING POSTURES   If you are feeling tired and unable to assume a healthy sitting posture, do not slouch or slump. This puts excessive strain on your back tissues, causing more damage and pain. Healthier options include:  Using more support, like a lumbar pillow.  Switching tasks to something that requires you to be upright or walking.  Talking a brief walk.  Lying down to rest in a neutral-spine position. PROLONGED STANDING WHILE SLIGHTLY LEANING FORWARD  When completing a task that requires you to lean forward while standing in one place for a long time, place either foot up on a stationary 2-4 inch high object to help maintain the best posture. When both feet are on the ground, the low back tends to lose its slight inward curve. If this curve flattens (or becomes too large), then the back and your other joints will experience too much stress, fatigue more quickly and can cause pain.  CORRECT STANDING POSTURES Proper standing posture should be assumed with all daily activities, even if they only take a few moments, like when brushing your teeth. As in sitting, your ears should fall over your shoulders and your shoulders should fall over your hips. You should keep a slight tension in your abdominal  muscles to brace your spine. Your tailbone should point down to the ground, not behind your body, resulting in an over-extended swayback posture.  INCORRECT STANDING POSTURES  Common incorrect standing postures include a forward head, locked knees and/or an excessive swayback. WALKING Walk with an upright posture. Your ears, shoulders and hips should all line-up. PROLONGED ACTIVITY IN A FLEXED POSITION When completing a task that requires you to bend forward   at your waist or lean over a low surface, try to find a way to stabilize 3 of 4 of your limbs. You can place a hand or elbow on your thigh or rest a knee on the surface you are reaching across. This will provide you more stability so that your muscles do not fatigue as quickly. By keeping your knees relaxed, or slightly bent, you will also reduce stress across your low back. CORRECT LIFTING TECHNIQUES DO :   Assume a wide stance. This will provide you more stability and the opportunity to get as close as possible to the object which you are lifting.  Tense your abdominals to brace your spine; then bend at the knees and hips. Keeping your back locked in a neutral-spine position, lift using your leg muscles. Lift with your legs, keeping your back straight.  Test the weight of unknown objects before attempting to lift them.  Try to keep your elbows locked down at your sides in order get the best strength from your shoulders when carrying an object.  Always ask for help when lifting heavy or awkward objects. INCORRECT LIFTING TECHNIQUES DO NOT:   Lock your knees when lifting, even if it is a small object.  Bend and twist. Pivot at your feet or move your feet when needing to change directions.  Assume that you cannot safely pick up a paperclip without proper posture.   This information is not intended to replace advice given to you by your health care provider. Make sure you discuss any questions you have with your health care provider.     Document Released: 03/26/2005 Document Revised: 08/10/2014 Document Reviewed: 07/08/2008 [employer] Interactive Patient Education 2016 [employer]  

## 2015-03-24 NOTE — ED Notes (Signed)
Pt reports lower back pain, has slipped or ruptured disc that was dx in [place]. Is having a lot of pain and has been feeling nauseated. Is unsure if the nausea is r/t the pain or not. Denies vomiting.  Pt is ambulatory.

## 2015-03-24 NOTE — ED Provider Notes (Signed)
CSN: [id number]     Arrival date & time 03/24/15  1240 History  By signing my name below, I, Rebecca Moody, attest that this documentation has been prepared under the direction and in the presence of non-physician practitioner, Roxy Horsemanobert Jenene Kauffmann, PA-C. Electronically Signed: Freida Busmaniana Moody, Scribe. 03/24/2015. 1:40 PM.    Chief Complaint  Patient presents with  . Back Pain  . Nausea   The history is provided by the patient. No language interpreter was used.    HPI Comments:  Rebecca Moody is a 39 y.o. female who presents to the Emergency Department complaining of 10/10 lower back pain, progressively worsening for 1 week. She reports a h/o same due to slipped discs. She reports increased pain with prolonged standing and prolonged sitting; notes she sits for hours at a time due to her job. Pt also notes pain radiates down the LLE and reports associated nausea secondary to pain. Pt states in the past she has received steroid injections in the back. She is followed by a  back specialist but states there is a long wait to be seen. She denies bowel/bladder incontinence and h/o DM. No alleviating factors noted.  [facility] [employer] [facility]  Past Medical History  Diagnosis Date  . H/O varicella   . AMA (advanced maternal age) multigravida 35+   . Hypertension    History reviewed. No pertinent past surgical history. Family History  Problem Relation Age of Onset  . Diabetes Father    Social History  Substance Use Topics  . Smoking status: Never Smoker   . Smokeless tobacco: Never Used  . Alcohol Use: No   OB History    Gravida Para Term Preterm AB TAB SAB Ectopic Multiple Living   3 2 2  0 1 0 1 0 0 2     Review of Systems  Constitutional: Negative for fever and chills.  Respiratory: Negative for shortness of breath.   Cardiovascular: Negative for chest pain.  Gastrointestinal: Positive for nausea.  Musculoskeletal: Positive for back pain.    Allergies  Review of  patient's allergies indicates no known allergies.  Home Medications   Prior to Admission medications   Medication Sig Start Date End Date Taking? Authorizing Provider  ASPIRIN PO Take by mouth as needed.    Historical Provider, MD  hydrochlorothiazide (MICROZIDE) 12.5 MG capsule Take 1 capsule (12.5 mg total) by mouth daily. NO MORE REFILLS WITHOUT OFFICE VISIT - 2ND NOTICE 03/14/15   Wallis BambergMario Mani, PA-C  metoprolol (LOPRESSOR) 50 MG tablet Take 0.5 tablets (25 mg total) by mouth 2 (two) times daily. NO MORE REFILLS WITHOUT OFFICE VISIT - 2ND NOTICE 03/14/15   Wallis BambergMario Mani, PA-C   BP 144/89 mmHg  Pulse 75  Temp(Src) 98.2 F (36.8 C) (Oral)  Resp 16  SpO2 99% Physical Exam  Constitutional: She is oriented to person, place, and time. She appears well-developed and well-nourished. No distress.  HENT:  Head: Normocephalic and atraumatic.  Eyes: Conjunctivae and EOM are normal. Right eye exhibits no discharge. Left eye exhibits no discharge. No scleral icterus.  Neck: Normal range of motion. Neck supple. No tracheal deviation present.  Cardiovascular: Normal rate, regular rhythm and normal heart sounds.  Exam reveals no gallop and no friction rub.   No murmur heard. Pulmonary/Chest: Effort normal and breath sounds normal. No respiratory distress. She has no wheezes.  Abdominal: Soft. She exhibits no distension. There is no tenderness.  Musculoskeletal: Normal range of motion.  Lumbar paraspinal muscles tender to palpation, no  bony tenderness, step-offs, or gross abnormality or deformity of spine, patient is able to ambulate, moves all extremities  Bilateral great toe extension intact Bilateral plantar/dorsiflexion intact  Neurological: She is alert and oriented to person, place, and time.  Sensation and strength intact bilaterally   Skin: Skin is warm and dry. She is not diaphoretic.  Psychiatric: She has a normal mood and affect. Her behavior is normal. Judgment and thought content normal.   Nursing note and vitals reviewed.   ED Course  Procedures   DIAGNOSTIC STUDIES:  Oxygen Saturation is 99% on RA, normal by my interpretation.    COORDINATION OF CARE:  1:36 PM Will discharge with prednisone, pain meds, and exercises to improve her pain. Advised pt to follow up with back specialist. Discussed treatment plan with pt at bedside and pt agreed to plan.   MDM   Final diagnoses:  Sciatica of left side    Patient with back pain.  No neurological deficits and normal neuro exam.  Patient is ambulatory.  No loss of bowel or bladder control.  Doubt cauda equina.  Denies fever,  doubt epidural abscess or other lesion. Recommend back exercises, stretching, RICE, and will treat with a short course of prednisone.  Encouraged the patient that there could be a need for additional workup and/or imaging such as MRI, if the symptoms do not resolve. Patient advised that if the back pain does not resolve, or radiates, this could progress to more serious conditions and is encouraged to follow-up with PCP or orthopedics within 2 weeks.     I personally performed the services described in this documentation, which was scribed in my presence. The recorded information has been reviewed and is accurate.     [name], PA-C 03/24/15 1348  Tilden Fossa, MD 03/24/15 [phone]

## 2015-05-16 ENCOUNTER — Ambulatory Visit (INDEPENDENT_AMBULATORY_CARE_PROVIDER_SITE_OTHER): Payer: Managed Care, Other (non HMO) | Admitting: Family Medicine

## 2015-05-16 VITALS — BP 140/90 | HR 84 | Temp 98.4°F | Resp 16 | Ht 66.5 in | Wt 269.8 lb

## 2015-05-16 DIAGNOSIS — Z3200 Encounter for pregnancy test, result unknown: Secondary | ICD-10-CM

## 2015-05-16 DIAGNOSIS — Z23 Encounter for immunization: Secondary | ICD-10-CM

## 2015-05-16 DIAGNOSIS — Z131 Encounter for screening for diabetes mellitus: Secondary | ICD-10-CM

## 2015-05-16 DIAGNOSIS — I10 Essential (primary) hypertension: Secondary | ICD-10-CM | POA: Diagnosis not present

## 2015-05-16 DIAGNOSIS — E669 Obesity, unspecified: Secondary | ICD-10-CM | POA: Diagnosis not present

## 2015-05-16 DIAGNOSIS — Z13220 Encounter for screening for lipoid disorders: Secondary | ICD-10-CM | POA: Diagnosis not present

## 2015-05-16 LAB — COMPLETE METABOLIC PANEL WITH GFR
ALBUMIN: 3.7 g/dL (ref 3.6–5.1)
ALK PHOS: 59 U/L (ref 33–115)
ALT: 13 U/L (ref 6–29)
AST: 16 U/L (ref 10–30)
BUN: 10 mg/dL (ref 7–25)
CO2: 27 mmol/L (ref 20–31)
Calcium: 8.8 mg/dL (ref 8.6–10.2)
Chloride: 103 mmol/L (ref 98–110)
Creat: 0.72 mg/dL (ref 0.50–1.10)
Glucose, Bld: 89 mg/dL (ref 65–99)
POTASSIUM: 4.3 mmol/L (ref 3.5–5.3)
Sodium: 137 mmol/L (ref 135–146)
Total Bilirubin: 0.6 mg/dL (ref 0.2–1.2)
Total Protein: 7.2 g/dL (ref 6.1–8.1)

## 2015-05-16 LAB — LIPID PANEL
CHOL/HDL RATIO: 3.1 ratio (ref <=5.0)
CHOLESTEROL: 167 mg/dL (ref 125–200)
HDL: 54 mg/dL (ref >=46)
LDL Cholesterol: 100 mg/dL (ref <130)
TRIGLYCERIDES: 64 mg/dL (ref <150)
VLDL: 13 mg/dL (ref <30)

## 2015-05-16 MED ORDER — HYDROCHLOROTHIAZIDE 12.5 MG PO CAPS: 12.5000 mg | ORAL_CAPSULE | Freq: Every day | ORAL | Status: DC

## 2015-05-16 MED ORDER — AMLODIPINE BESYLATE 2.5 MG PO TABS: 2.5000 mg | ORAL_TABLET | Freq: Every day | ORAL | Status: DC

## 2015-05-16 NOTE — Patient Instructions (Signed)
You should receive a call or letter about your lab results within the next week to 10 days.  Continue the hydrochlorothiazide, but stop metoprolol and start amlodipine 1 pill once per day. Check your blood pressures outside of the office a few times per week, and bring those records with you to the next visit within the next 2-3 months. I did give you enough medication for up to 3 months, but you do need to follow-up prior to running out to make sure that this is the correct medication for you.   Try the different techniques for portion control and increase exercise to most days of the week with a goal of 150 minutes per week. Congratulations again on quitting smoking, and let us know if we can help with your weight loss efforts.  DASH Eating Plan DASH stands for "Dietary Approaches to Stop Hypertension." The DASH eating plan is a healthy eating plan that has been shown to reduce high blood pressure (hypertension). Additional health benefits may include reducing the risk of type 2 diabetes mellitus, heart disease, and stroke. The DASH eating plan may also help with weight loss. WHAT DO I NEED TO KNOW ABOUT THE DASH EATING PLAN? For the DASH eating plan, you will follow these general guidelines:  Choose foods with a percent daily value for sodium of less than 5% (as listed on the food label).  Use salt-free seasonings or herbs instead of table salt or sea salt.  Check with your health care provider or pharmacist before using salt substitutes.  Eat lower-sodium products, often labeled as "lower sodium" or "no salt added."  Eat fresh foods.  Eat more vegetables, fruits, and low-fat dairy products.  Choose whole grains. Look for the word "whole" as the first word in the ingredient list.  Choose fish and skinless chicken or [place] more often than red meat. Limit fish, poultry, and meat to 6 oz (170 g) each day.  Limit sweets, desserts, sugars, and sugary drinks.  Choose heart-healthy  fats.  Limit cheese to 1 oz (28 g) per day.  Eat more home-cooked food and less restaurant, buffet, and fast food.  Limit fried foods.  Cook foods using methods other than frying.  Limit canned vegetables. If you do use them, rinse them well to decrease the sodium.  When eating at a restaurant, ask that your food be prepared with less salt, or no salt if possible. WHAT FOODS CAN I EAT? Seek help from a dietitian for individual calorie needs. Grains Whole grain or whole wheat bread. Brown rice. Whole grain or whole wheat pasta. Quinoa, bulgur, and whole grain cereals. Low-sodium cereals. Corn or whole wheat flour tortillas. Whole grain cornbread. Whole grain crackers. Low-sodium crackers. Vegetables Fresh or frozen vegetables (raw, steamed, roasted, or grilled). Low-sodium or reduced-sodium tomato and vegetable juices. Low-sodium or reduced-sodium tomato sauce and paste. Low-sodium or reduced-sodium canned vegetables.  Fruits All fresh, canned (in natural juice), or frozen fruits. Meat and Other Protein Products Ground beef (85% or leaner), grass-fed beef, or beef trimmed of fat. Skinless chicken or [place]. Ground chicken or [place]. Pork trimmed of fat. All fish and seafood. Eggs. Dried beans, peas, or lentils. Unsalted nuts and seeds. Unsalted canned beans. Dairy Low-fat dairy products, such as skim or 1% milk, 2% or reduced-fat cheeses, low-fat ricotta or cottage cheese, or plain low-fat yogurt. Low-sodium or reduced-sodium cheeses. Fats and Oils Tub margarines without trans fats. Light or reduced-fat mayonnaise and salad dressings (reduced sodium). Avocado. Safflower, olive, or canola  oils. Natural peanut or almond butter. Other Unsalted popcorn and pretzels. The items listed above may not be a complete list of recommended foods or beverages. Contact your dietitian for more options. WHAT FOODS ARE NOT RECOMMENDED? Grains White bread. White pasta. White rice. Refined cornbread.  Bagels and croissants. Crackers that contain trans fat. Vegetables Creamed or fried vegetables. Vegetables in a cheese sauce. Regular canned vegetables. Regular canned tomato sauce and paste. Regular tomato and vegetable juices. Fruits Dried fruits. Canned fruit in light or heavy syrup. Fruit juice. Meat and Other Protein Products Fatty cuts of meat. Ribs, chicken wings, bacon, sausage, bologna, salami, chitterlings, fatback, hot dogs, bratwurst, and packaged luncheon meats. Salted nuts and seeds. Canned beans with salt. Dairy Whole or 2% milk, cream, half-and-half, and cream cheese. Whole-fat or sweetened yogurt. Full-fat cheeses or blue cheese. Nondairy creamers and whipped toppings. Processed cheese, cheese spreads, or cheese curds. Condiments Onion and garlic salt, seasoned salt, table salt, and sea salt. Canned and packaged gravies. Worcestershire sauce. Tartar sauce. Barbecue sauce. Teriyaki sauce. Soy sauce, including reduced sodium. Steak sauce. Fish sauce. Oyster sauce. Cocktail sauce. Horseradish. Ketchup and mustard. Meat flavorings and tenderizers. Bouillon cubes. Hot sauce. Tabasco sauce. Marinades. Taco seasonings. Relishes. Fats and Oils Butter, stick margarine, lard, shortening, ghee, and bacon fat. Coconut, palm kernel, or palm oils. Regular salad dressings. Other Pickles and olives. Salted popcorn and pretzels. The items listed above may not be a complete list of foods and beverages to avoid. Contact your dietitian for more information. WHERE CAN I FIND MORE INFORMATION? National Heart, Lung, and Blood Institute: [employer]Prom[employer].it   This information is not intended to replace advice given to you by your health care provider. Make sure you discuss any questions you have with your health care provider.   Document Released: 03/15/2011 Document Revised: 04/16/2014 Document Reviewed: 01/28/2013 Elsevier Interactive Patient Education [employer].

## 2015-05-16 NOTE — Progress Notes (Addendum)
Subjective:    Patient ID: Rebecca Moody, female    DOB: [birth year], 40 y.o.   MRN: [id number] By signing my name below, I, [name], attest that this documentation has been prepared under the direction and in the presence of [name], MD.  Electronically Signed: [name], Medical Scribe. 05/16/2015. 11:23 AM.  HPI HPI Comments: Rebecca Moody is a 40 y.o. female who presents to the [facility] for a follow-up for hypertension. Takes HCTZ 12.5 mg qd, metoprolol 25 mg BID. Looks like this was most recently refilled for #15 on December 5th and was advised to have follow-up office visit. Last visit here was in September 2015. Blood pressure at that time was 160/100. Was started on medication at that time. Has not been seen here for follow-up since. Patient states she had been compliant with her medications, but ran out of her medications a few days ago (she had some extra medications left over from previous). She has not been taking her blood pressures at home. Patient notes that she has been waking up with headaches. She denies chest pain, weakness, SOB, difficulty with speech, dizziness, and lightheadedness. Her father (who passed away in 08-21-12) had been on dialysis but she is not certain why. She is a former smoker but quit recently. Patient notes she used to be thin, but she has been gaining weight over the years. She is working on losing weight through diet and exercise. She recently started exercising last week. Patient mostly drinks water and hot tea; she avoids sweetened and diet drinks.  Patient is fasting today. She comes here for primary care. She agrees to have the flu shot today.  Lab Results  Component Value Date   CREATININE 0.82 02/06/2010   Wt Readings from Last 3 Encounters:  05/16/15 269 lb 12.8 oz (122.38 kg)  12/23/13 235 lb (106.595 kg)  09/27/11 242 lb (109.77 kg)  Body mass index is 42.9 kg/(m^2).   Patient used to serve in the  [employer]  Patient Active Problem List   Diagnosis Date Noted  . Accelerated hypertension 12/23/2013  . Normal pregnancy 09/27/2011   Past Medical History  Diagnosis Date  . H/O varicella   . AMA (advanced maternal age) multigravida 35+   . Hypertension    History reviewed. No pertinent past surgical history. No Known Allergies Prior to Admission medications   Medication Sig Start Date End Date Taking? Authorizing Provider  hydrochlorothiazide (MICROZIDE) 12.5 MG capsule Take 1 capsule (12.5 mg total) by mouth daily. NO MORE REFILLS WITHOUT OFFICE VISIT - 2ND NOTICE 03/14/15  Yes Wallis Bamberg, PA-C  metoprolol (LOPRESSOR) 50 MG tablet Take 0.5 tablets (25 mg total) by mouth 2 (two) times daily. NO MORE REFILLS WITHOUT OFFICE VISIT - 2ND NOTICE 03/14/15  Yes Wallis Bamberg, PA-C  ASPIRIN PO Take by mouth as needed. Reported on 05/16/2015    Historical Provider, MD  oxyCODONE-acetaminophen (PERCOCET/ROXICET) 5-325 MG tablet Take 1-2 tablets by mouth every 6 (six) hours as needed for severe pain. Patient not taking: Reported on 05/16/2015 03/24/15   [name], PA-C   Social History   Social History  . Marital Status: Married    Spouse Name: N/A  . Number of Children: N/A  . Years of Education: N/A   Occupational History  . Not on file.   Social History Main Topics  . Smoking status: Never Smoker   . Smokeless tobacco: Never Used  . Alcohol Use: No  . Drug  Use: No  . Sexual Activity: Yes   Other Topics Concern  . Not on file   Social History Narrative     Review of Systems  Constitutional: Negative for fatigue and unexpected weight change.  Respiratory: Negative for chest tightness and shortness of breath.   Cardiovascular: Negative for chest pain, palpitations and leg swelling.  Gastrointestinal: Negative for abdominal pain and blood in stool.  Neurological: Positive for headaches. Negative for dizziness, syncope, speech difficulty, weakness and light-headedness.        Objective:   Physical Exam  Constitutional: She is oriented to person, place, and time. She appears well-developed and well-nourished.  HENT:  Head: Normocephalic and atraumatic.  Eyes: Conjunctivae and EOM are normal. Pupils are equal, round, and reactive to light. Right eye exhibits no nystagmus. Left eye exhibits no nystagmus.  Neck: Carotid bruit is not present.  Cardiovascular: Normal rate, regular rhythm, normal heart sounds and intact distal pulses.   Pulmonary/Chest: Effort normal and breath sounds normal.  Abdominal: Soft. She exhibits no pulsatile midline mass. There is no tenderness.  Neurological: She is alert and oriented to person, place, and time. She displays a negative Romberg sign.  No pronator drift. Non-focal exam.  Skin: Skin is warm and dry.  Psychiatric: She has a normal mood and affect. Her behavior is normal.  Vitals reviewed.   Filed Vitals:   05/16/15 1052  BP: 153/97  Pulse: 84  Temp: 98.4 F (36.9 C)  TempSrc: Oral  Resp: 16  Height: 5' 6.5" (1.689 m)  Weight: 269 lb 12.8 oz (122.38 kg)  SpO2: 97%         Assessment & Plan:   Rebecca Moody is a 40 y.o. female Essential hypertension - Plan: amLODipine (NORVASC) 2.5 MG tablet, hydrochlorothiazide (MICROZIDE) 12.5 MG capsule  - For ease of regimen, will change to Norvasc 2.5 mg daily, continue HCTZ 12.5 mg daily. CMP and lipids pending.  Obesity -   -Increase activity/exercise discussed as well as portion control and other diet changes. Discuss multiple tricks for portion control.  She plans on continuing her new exercise regimen and watching diet, but discussed options of nutritionist or even meeting with bariatric surgeon if needed based on her current BMI. Plan on initial diet and size the next 3-6 months.   Screening for diabetes mellitus - Plan: COMPLETE METABOLIC PANEL WITH GFR  Screening for hyperlipidemia - Plan: Lipid panel   flu vaccine. Unfortunately she left prior to receiving  this, we'll place order for flu vaccine so she can return later today to have this given.    Meds ordered this encounter  Medications  . amLODipine (NORVASC) 2.5 MG tablet    Sig: Take 1 tablet (2.5 mg total) by mouth daily.    Dispense:  30 tablet    Refill:  2  . hydrochlorothiazide (MICROZIDE) 12.5 MG capsule    Sig: Take 1 capsule (12.5 mg total) by mouth daily.    Dispense:  30 capsule    Refill:  2   Patient Instructions  You should receive a call or letter about your lab results within the next week to 10 days.  Continue the hydrochlorothiazide, but stop metoprolol and start amlodipine 1 pill once per day. Check your blood pressures outside of the office a few times per week, and bring those records with you to the next visit within the next 2-3 months. I did give you enough medication for up to 3 months, but you do need to  follow-up prior to running out to make sure that this is the correct medication for you.   Try the different techniques for portion control and increase exercise to most days of the week with a goal of 150 minutes per week. Congratulations again on quitting smoking, and let us know if we can help with your weight loss efforts.  DASH Eating Plan DASH stands for "Dietary Approaches to Stop Hypertension." The DASH eating plan is a healthy eating plan that has been shown to reduce high blood pressure (hypertension). Additional health benefits may include reducing the risk of type 2 diabetes mellitus, heart disease, and stroke. The DASH eating plan may also help with weight loss. WHAT DO I NEED TO KNOW ABOUT THE DASH EATING PLAN? For the DASH eating plan, you will follow these general guidelines:  Choose foods with a percent daily value for sodium of less than 5% (as listed on the food label).  Use salt-free seasonings or herbs instead of table salt or sea salt.  Check with your health care provider or pharmacist before using salt substitutes.  Eat lower-sodium  products, often labeled as "lower sodium" or "no salt added."  Eat fresh foods.  Eat more vegetables, fruits, and low-fat dairy products.  Choose whole grains. Look for the word "whole" as the first word in the ingredient list.  Choose fish and skinless chicken or [place] more often than red meat. Limit fish, poultry, and meat to 6 oz (170 g) each day.  Limit sweets, desserts, sugars, and sugary drinks.  Choose heart-healthy fats.  Limit cheese to 1 oz (28 g) per day.  Eat more home-cooked food and less restaurant, buffet, and fast food.  Limit fried foods.  Cook foods using methods other than frying.  Limit canned vegetables. If you do use them, rinse them well to decrease the sodium.  When eating at a restaurant, ask that your food be prepared with less salt, or no salt if possible. WHAT FOODS CAN I EAT? Seek help from a dietitian for individual calorie needs. Grains Whole grain or whole wheat bread. Brown rice. Whole grain or whole wheat pasta. Quinoa, bulgur, and whole grain cereals. Low-sodium cereals. Corn or whole wheat flour tortillas. Whole grain cornbread. Whole grain crackers. Low-sodium crackers. Vegetables Fresh or frozen vegetables (raw, steamed, roasted, or grilled). Low-sodium or reduced-sodium tomato and vegetable juices. Low-sodium or reduced-sodium tomato sauce and paste. Low-sodium or reduced-sodium canned vegetables.  Fruits All fresh, canned (in natural juice), or frozen fruits. Meat and Other Protein Products Ground beef (85% or leaner), grass-fed beef, or beef trimmed of fat. Skinless chicken or [place]. Ground chicken or [place]. Pork trimmed of fat. All fish and seafood. Eggs. Dried beans, peas, or lentils. Unsalted nuts and seeds. Unsalted canned beans. Dairy Low-fat dairy products, such as skim or 1% milk, 2% or reduced-fat cheeses, low-fat ricotta or cottage cheese, or plain low-fat yogurt. Low-sodium or reduced-sodium cheeses. Fats and Oils Tub  margarines without trans fats. Light or reduced-fat mayonnaise and salad dressings (reduced sodium). Avocado. Safflower, olive, or canola oils. Natural peanut or almond butter. Other Unsalted popcorn and pretzels. The items listed above may not be a complete list of recommended foods or beverages. Contact your dietitian for more options. WHAT FOODS ARE NOT RECOMMENDED? Grains White bread. White pasta. White rice. Refined cornbread. Bagels and croissants. Crackers that contain trans fat. Vegetables Creamed or fried vegetables. Vegetables in a cheese sauce. Regular canned vegetables. Regular canned tomato sauce and paste. Regular tomato and  vegetable juices. Fruits Dried fruits. Canned fruit in light or heavy syrup. Fruit juice. Meat and Other Protein Products Fatty cuts of meat. Ribs, chicken wings, bacon, sausage, bologna, salami, chitterlings, fatback, hot dogs, bratwurst, and packaged luncheon meats. Salted nuts and seeds. Canned beans with salt. Dairy Whole or 2% milk, cream, half-and-half, and cream cheese. Whole-fat or sweetened yogurt. Full-fat cheeses or blue cheese. Nondairy creamers and whipped toppings. Processed cheese, cheese spreads, or cheese curds. Condiments Onion and garlic salt, seasoned salt, table salt, and sea salt. Canned and packaged gravies. Worcestershire sauce. Tartar sauce. Barbecue sauce. Teriyaki sauce. Soy sauce, including reduced sodium. Steak sauce. Fish sauce. Oyster sauce. Cocktail sauce. Horseradish. Ketchup and mustard. Meat flavorings and tenderizers. Bouillon cubes. Hot sauce. Tabasco sauce. Marinades. Taco seasonings. Relishes. Fats and Oils Butter, stick margarine, lard, shortening, ghee, and bacon fat. Coconut, palm kernel, or palm oils. Regular salad dressings. Other Pickles and olives. Salted popcorn and pretzels. The items listed above may not be a complete list of foods and beverages to avoid. Contact your dietitian for more information. WHERE CAN I  FIND MORE INFORMATION? National Heart, Lung, and Blood Institute: [employer]   This information is not intended to replace advice given to you by your health care provider. Make sure you discuss any questions you have with your health care provider.   Document Released: 03/15/2011 Document Revised: 04/16/2014 Document Reviewed: 01/28/2013 Elsevier Interactive Patient Education [employer]     I personally performed the services described in this documentation, which was scribed in my presence. The recorded information has been reviewed and considered, and addended by me as needed.

## 2015-05-25 ENCOUNTER — Encounter: Payer: Self-pay | Admitting: *Deleted

## 2015-05-27 NOTE — Progress Notes (Signed)
Order(s) created erroneously. Erroneous order ID: [id number]  Order moved by: Earna Coder  Order move date/time: 05/27/2015 11:27 AM  Source Patient: [id number]  Source Contact: 05/16/2015  Destination Patient: [id number]  Destination Contact: 06/24/2012

## 2015-08-31 ENCOUNTER — Other Ambulatory Visit: Payer: Self-pay | Admitting: Family Medicine

## 2015-09-07 ENCOUNTER — Other Ambulatory Visit: Payer: Self-pay | Admitting: Family Medicine

## 2015-09-28 ENCOUNTER — Other Ambulatory Visit: Payer: Self-pay | Admitting: Family Medicine

## 2015-10-06 ENCOUNTER — Ambulatory Visit (INDEPENDENT_AMBULATORY_CARE_PROVIDER_SITE_OTHER): Payer: Managed Care, Other (non HMO) | Admitting: Physician Assistant

## 2015-10-06 VITALS — BP 138/88 | HR 83 | Temp 98.2°F | Resp 16 | Ht 66.5 in | Wt 276.0 lb

## 2015-10-06 DIAGNOSIS — R635 Abnormal weight gain: Secondary | ICD-10-CM

## 2015-10-06 DIAGNOSIS — I10 Essential (primary) hypertension: Secondary | ICD-10-CM | POA: Diagnosis not present

## 2015-10-06 LAB — COMPREHENSIVE METABOLIC PANEL
ALT: 16 U/L (ref 6–29)
AST: 12 U/L (ref 10–30)
Albumin: 3.8 g/dL (ref 3.6–5.1)
Alkaline Phosphatase: 68 U/L (ref 33–115)
BUN: 17 mg/dL (ref 7–25)
CHLORIDE: 98 mmol/L (ref 98–110)
CO2: 26 mmol/L (ref 20–31)
CREATININE: 0.8 mg/dL (ref 0.50–1.10)
Calcium: 9.4 mg/dL (ref 8.6–10.2)
Glucose, Bld: 120 mg/dL — ABNORMAL HIGH (ref 65–99)
POTASSIUM: 4.5 mmol/L (ref 3.5–5.3)
SODIUM: 134 mmol/L — AB (ref 135–146)
TOTAL PROTEIN: 7.8 g/dL (ref 6.1–8.1)
Total Bilirubin: 0.7 mg/dL (ref 0.2–1.2)

## 2015-10-06 LAB — CBC
HCT: 39.3 % (ref 35.0–45.0)
Hemoglobin: 13.4 g/dL (ref 11.7–15.5)
MCH: 27.8 pg (ref 27.0–33.0)
MCHC: 34.1 g/dL (ref 32.0–36.0)
MCV: 81.5 fL (ref 80.0–100.0)
MPV: 9.6 fL (ref 7.5–12.5)
PLATELETS: 408 10*3/uL — AB (ref 140–400)
RBC: 4.82 MIL/uL (ref 3.80–5.10)
RDW: 16.7 % — ABNORMAL HIGH (ref 11.0–15.0)
WBC: 7.5 10*3/uL (ref 3.8–10.8)

## 2015-10-06 LAB — TSH: TSH: 1.81 m[IU]/L

## 2015-10-06 MED ORDER — HYDROCHLOROTHIAZIDE 12.5 MG PO CAPS: ORAL_CAPSULE | ORAL | Status: DC

## 2015-10-06 MED ORDER — AMLODIPINE BESYLATE 2.5 MG PO TABS: ORAL_TABLET | ORAL | Status: DC

## 2015-10-06 NOTE — Progress Notes (Signed)
[facility] [address], [place] [address] [phone]  Date:  10/06/2015   Name:  Rebecca MakoJosette T Hancock   DOB:  05/11/1975   MRN:  [id number]  PCP:  No PCP Per Patient    Chief Complaint: Medication Refill   History of Present Illness:  This is a 40 y.o. female with PMH HTN who is presenting needing refills of her amlodipine and hctz. Last seen 5 months ago here by Dr. Neva SeatGreene. She takes amlodipine 2.5 mg and hctz 12.5 mg, working well for her. She does not have a bp monitor at home. She will occ take her bp at [facility]. She does not have any concerns today.  She has gained 10 pounds since last visit. She states she has been steadily gaining weight for about 2 years. She has been having trouble with a herniated disc and left sided sciatica. She has been getting spinal injections and doing much better. She has an appt with a trainer soon since she is not is as much pain now. She is looking forward to exercising and losing weight and hoping she will eventually be able to come off her bp meds.    Review of Systems:  Review of Systems See HPI  Patient Active Problem List   Diagnosis Date Noted  . Accelerated hypertension 12/23/2013  . Normal pregnancy 09/27/2011    Prior to Admission medications   Medication Sig Start Date End Date Taking? Authorizing Provider  amLODipine (NORVASC) 2.5 MG tablet TAKE 1 TABLET(2.5 MG) BY MOUTH DAILY 09/02/15  Yes Shade FloodJeffrey R Greene, MD  ASPIRIN PO Take by mouth as needed. Reported on 05/16/2015   Yes Historical Provider, MD  hydrochlorothiazide (MICROZIDE) 12.5 MG capsule TAKE 1 CAPSULE(12.5 MG) BY MOUTH DAILY 09/08/15  Yes Shade FloodJeffrey R Greene, MD    No Known Allergies  History reviewed. No pertinent past surgical history.  Social History  Substance Use Topics  . Smoking status: Never Smoker   . Smokeless tobacco: Never Used  . Alcohol Use: No    Family History  Problem Relation Age of Onset  . Diabetes Father   . Stroke  Father     Medication list has been reviewed and updated.  Physical Examination:  Physical Exam  Constitutional: She is oriented to person, place, and time. She appears well-developed and well-nourished. No distress.  HENT:  Head: Normocephalic and atraumatic.  Right Ear: Hearing normal.  Left Ear: Hearing normal.  Nose: Nose normal.  Eyes: Conjunctivae and lids are normal. Right eye exhibits no discharge. Left eye exhibits no discharge. No scleral icterus.  Neck: Carotid bruit is not present. No thyromegaly present.  Cardiovascular: Normal rate, regular rhythm, normal heart sounds and normal pulses.   No murmur heard. Pulmonary/Chest: Effort normal and breath sounds normal. No respiratory distress. She has no wheezes. She has no rhonchi. She has no rales.  Musculoskeletal: Normal range of motion.  Lymphadenopathy:       Head (right side): No submental, no submandibular and no tonsillar adenopathy present.       Head (left side): No submental, no submandibular and no tonsillar adenopathy present.    She has no cervical adenopathy.  Neurological: She is alert and oriented to person, place, and time.  Skin: Skin is warm, dry and intact. No lesion and no rash noted.  Psychiatric: She has a normal mood and affect. Her speech is normal and behavior is normal. Thought content normal.   BP 138/88 mmHg  Pulse  83  Temp(Src) 98.2 F (36.8 C) (Oral)  Resp 16  Ht 5' 6.5" (1.689 m)  Wt 276 lb (125.193 kg)  BMI 43.89 kg/m2  SpO2 97%  Assessment and Plan:  1. Essential hypertension Doing well on current regimen - refilled. We discussed diet and exercise, she has hope of coming off bp meds eventually. Return in 6 months for f/u. - CBC - Comprehensive metabolic panel - amLODipine (NORVASC) 2.5 MG tablet; TAKE 1 TABLET(2.5 MG) BY MOUTH DAILY  Dispense: 90 tablet; Refill: 1 - hydrochlorothiazide (MICROZIDE) 12.5 MG capsule; TAKE 1 CAPSULE(12.5 MG) BY MOUTH DAILY  Dispense: 90 capsule;  Refill: 1  2. Weight gain See above.  - TSH   Roswell MinersNicole V. Dyke BrackettBush, PA-C, MHS [facility]  10/06/2015

## 2015-10-06 NOTE — Patient Instructions (Addendum)
Work on exercise and weight loss Return in 6 months for follow up.    IF you received an x-ray today, you will receive an invoice from [facility]. Please contact [facility] at [phone] with questions or concerns regarding your invoice.   IF you received labwork today, you will receive an invoice from [facility]. Please contact [facility] at [phone] with questions or concerns regarding your invoice.   Our billing staff will not be able to assist you with questions regarding bills from these companies.  You will be contacted with the lab results as soon as they are available. The fastest way to get your results is to activate your My Chart account. Instructions are located on the last page of this paperwork. If you have not heard from us regarding the results in 2 weeks, please contact this office.

## 2016-01-03 ENCOUNTER — Ambulatory Visit (HOSPITAL_COMMUNITY)
Admission: EM | Admit: 2016-01-03 | Discharge: 2016-01-03 | Disposition: A | Discharge status: Home / Self Care | Payer: Managed Care, Other (non HMO) | Attending: Family Medicine | Admitting: Family Medicine

## 2016-01-03 ENCOUNTER — Ambulatory Visit (INDEPENDENT_AMBULATORY_CARE_PROVIDER_SITE_OTHER): Payer: Managed Care, Other (non HMO)

## 2016-01-03 ENCOUNTER — Encounter (HOSPITAL_COMMUNITY): Payer: Self-pay | Admitting: *Deleted

## 2016-01-03 DIAGNOSIS — J069 Acute upper respiratory infection, unspecified: Secondary | ICD-10-CM

## 2016-01-03 LAB — POCT PREGNANCY, URINE: PREG TEST UR: NEGATIVE

## 2016-01-03 MED ORDER — GUAIFENESIN-CODEINE 100-10 MG/5ML PO SYRP: 10.0000 mL | ORAL_SOLUTION | Freq: Four times a day (QID) | ORAL | 0 refills | Status: DC | PRN

## 2016-01-03 MED ORDER — IPRATROPIUM BROMIDE 0.06 % NA SOLN: 2.0000 | Freq: Four times a day (QID) | NASAL | 1 refills | Status: DC

## 2016-01-03 NOTE — ED Triage Notes (Signed)
Pt   Reports      symtpms  Of  Cough     Congested       She   Wants   A  preg  Test      Unsure of  Her last  Period    States  She  Felt  This  Way  The  Last time she  Was  Pregnant     She denys  Any  Urinary      Symptoms

## 2016-01-03 NOTE — ED Provider Notes (Signed)
[facility]    CSN: [id number] Arrival date & time: 01/03/16  1003     History   Chief Complaint Chief Complaint  Patient presents with  . Cough    HPI Rebecca Moody is a 40 y.o. female.    Cough  Cough characteristics:  Productive Sputum characteristics:  Yellow Severity:  Mild Onset quality:  Gradual Duration:  1 week Progression:  Worsening Chronicity:  New Smoker: no   Relieved by:  None tried Ineffective treatments:  None tried Associated symptoms: no fever, no rhinorrhea and no wheezing     Past Medical History:  Diagnosis Date  . AMA (advanced maternal age) multigravida 35+   . H/O varicella   . Hypertension     Patient Active Problem List   Diagnosis Date Noted  . Accelerated hypertension 12/23/2013  . Normal pregnancy 09/27/2011    History reviewed. No pertinent surgical history.  OB History    Gravida Para Term Preterm AB Living   3 2 2  0 1 2   SAB TAB Ectopic Multiple Live Births   1 0 0 0 2       Home Medications    Prior to Admission medications   Medication Sig Start Date End Date Taking? Authorizing Provider  amLODipine (NORVASC) 2.5 MG tablet TAKE 1 TABLET(2.5 MG) BY MOUTH DAILY 10/06/15   Dorna Leitz, PA-C  ASPIRIN PO Take by mouth as needed. Reported on 05/16/2015    Historical Provider, MD  hydrochlorothiazide (MICROZIDE) 12.5 MG capsule TAKE 1 CAPSULE(12.5 MG) BY MOUTH DAILY 10/06/15   Dorna Leitz, PA-C    Family History Family History  Problem Relation Age of Onset  . Diabetes Father   . Stroke Father     Social History Social History  Substance Use Topics  . Smoking status: Never Smoker  . Smokeless tobacco: Never Used  . Alcohol use No     Allergies   Review of patient's allergies indicates no known allergies.   Review of Systems Review of Systems  Constitutional: Negative.  Negative for fever.  HENT: Negative for rhinorrhea.   Respiratory: Positive for cough. Negative for wheezing.     Cardiovascular: Negative.   Genitourinary: Negative for menstrual problem.  All other systems reviewed and are negative.    Physical Exam Triage Vital Signs ED Triage Vitals  Enc Vitals Group     BP 01/03/16 1029 134/90     Pulse Rate 01/03/16 1029 95     Resp 01/03/16 1029 16     Temp 01/03/16 1029 99.6 F (37.6 C)     Temp Source 01/03/16 1029 Oral     SpO2 01/03/16 1029 97 %     Weight --      Height --      Head Circumference --      Peak Flow --      Pain Score 01/03/16 1047 8     Pain Loc --      Pain Edu? --      Excl. in GC? --    No data found.   Updated Vital Signs BP 134/90 (BP Location: Left Arm)   Pulse 95   Temp 99.6 F (37.6 C) (Oral)   Resp 16   LMP  (LMP Unknown)   SpO2 97%   Visual Acuity Right Eye Distance:   Left Eye Distance:   Bilateral Distance:    Right Eye Near:   Left Eye Near:    Bilateral Near:  Physical Exam  Constitutional: She is oriented to person, place, and time. She appears well-developed and well-nourished. No distress.  HENT:  Mouth/Throat: Oropharynx is clear and moist.  Neck: Normal range of motion. Neck supple.  Cardiovascular: Normal rate, regular rhythm, normal heart sounds and intact distal pulses.   Pulmonary/Chest: Effort normal and breath sounds normal.  Lymphadenopathy:    She has no cervical adenopathy.  Neurological: She is alert and oriented to person, place, and time.  Skin: Skin is warm and dry.  Nursing note and vitals reviewed.    UC Treatments / Results  Labs (all labs ordered are listed, but only abnormal results are displayed) Labs Reviewed  POCT PREGNANCY, URINE    EKG  EKG Interpretation None       Radiology No results found. X-rays reviewed and report per radiologist.  Procedures Procedures (including critical care time)  Medications Ordered in UC Medications - No data to display   Initial Impression / Assessment and Plan / UC Course  I have reviewed the triage  vital signs and the nursing notes.  Pertinent labs & imaging results that were available during my care of the patient were reviewed by me and considered in my medical decision making (see chart for details).  Clinical Course      Final Clinical Impressions(s) / UC Diagnoses   Final diagnoses:  None    New Prescriptions New Prescriptions   No medications on file     Linna HoffJames D Josh Nicolosi, MD 01/03/16 1144

## 2016-04-17 ENCOUNTER — Other Ambulatory Visit: Payer: Self-pay | Admitting: Physician Assistant

## 2016-04-17 DIAGNOSIS — I10 Essential (primary) hypertension: Secondary | ICD-10-CM

## 2016-11-24 ENCOUNTER — Encounter (HOSPITAL_COMMUNITY): Payer: Self-pay | Admitting: Nurse Practitioner

## 2016-11-24 ENCOUNTER — Emergency Department (HOSPITAL_COMMUNITY)
Admission: EM | Admit: 2016-11-24 | Discharge: 2016-11-24 | Disposition: A | Discharge status: Home / Self Care | Payer: Managed Care, Other (non HMO) | Attending: Emergency Medicine | Admitting: Emergency Medicine

## 2016-11-24 DIAGNOSIS — I10 Essential (primary) hypertension: Secondary | ICD-10-CM | POA: Diagnosis not present

## 2016-11-24 DIAGNOSIS — M5432 Sciatica, left side: Secondary | ICD-10-CM | POA: Insufficient documentation

## 2016-11-24 DIAGNOSIS — M545 Low back pain: Secondary | ICD-10-CM | POA: Diagnosis present

## 2016-11-24 MED ORDER — CYCLOBENZAPRINE HCL 10 MG PO TABS: 10.0000 mg | ORAL_TABLET | Freq: Two times a day (BID) | ORAL | 0 refills | Status: DC | PRN

## 2016-11-24 MED ORDER — DICLOFENAC SODIUM 50 MG PO TBEC: 50.0000 mg | DELAYED_RELEASE_TABLET | Freq: Two times a day (BID) | ORAL | 0 refills | Status: DC

## 2016-11-24 NOTE — Discharge Instructions (Signed)
Do not take the muscle relaxant while driving as it will make you sleepy. Follow up with Dr. Danielle Dess. Return here as needed.
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
## 2017-01-17 ENCOUNTER — Ambulatory Visit (INDEPENDENT_AMBULATORY_CARE_PROVIDER_SITE_OTHER): Payer: Managed Care, Other (non HMO)

## 2017-01-17 ENCOUNTER — Ambulatory Visit (HOSPITAL_COMMUNITY)
Admission: EM | Admit: 2017-01-17 | Discharge: 2017-01-17 | Disposition: A | Discharge status: Home / Self Care | Payer: Managed Care, Other (non HMO) | Attending: Family Medicine | Admitting: Family Medicine

## 2017-01-17 ENCOUNTER — Encounter (HOSPITAL_COMMUNITY): Payer: Self-pay | Admitting: Emergency Medicine

## 2017-01-17 DIAGNOSIS — S93602A Unspecified sprain of left foot, initial encounter: Secondary | ICD-10-CM | POA: Diagnosis not present

## 2017-01-17 DIAGNOSIS — I10 Essential (primary) hypertension: Secondary | ICD-10-CM

## 2017-01-17 DIAGNOSIS — M79672 Pain in left foot: Secondary | ICD-10-CM | POA: Diagnosis not present

## 2017-01-17 MED ORDER — NAPROXEN 500 MG PO TABS: 500.0000 mg | ORAL_TABLET | Freq: Two times a day (BID) | ORAL | 0 refills | Status: DC | PRN

## 2017-01-17 NOTE — ED Triage Notes (Signed)
PT fell yesterday and injured left foot. PT reports she is able to bear light weight.

## 2017-01-17 NOTE — ED Provider Notes (Signed)
[facility]    CSN: [id number] Arrival date & time: 01/17/17  1211     History   Chief Complaint Chief Complaint  Patient presents with  . Foot Pain    HPI Rebecca Moody is a 41 y.o. female.   41 year old female presents with left foot pain and injury. She was walking back to her desk at work yesterday when she slipped and fell. She landed on the outside of her left foot. She was able to bear weight and continued to walk on her foot yesterday. She took some Aleve with minimal relief. Today she woke up with more pain, especially when moving her toes. She has not taken any medication today. She wants to make sure nothing is broken. She has a history of HTN and is on HCTZ and Norvasc daily. She also is on Semaglutide for elevated sugar. No previous injury to her foot.     The history is provided by the patient.    Past Medical History:  Diagnosis Date  . AMA (advanced maternal age) multigravida 35+   . H/O varicella   . Hypertension     Patient Active Problem List   Diagnosis Date Noted  . Accelerated hypertension 12/23/2013  . Normal pregnancy 09/27/2011    History reviewed. No pertinent surgical history.  OB History    Gravida Para Term Preterm AB Living   0 1 2   SAB TAB Ectopic Multiple Live Births   1 0 0 0 2       Home Medications    Prior to Admission medications   Medication Sig Start Date End Date Taking? Authorizing Provider  amLODipine (NORVASC) 2.5 MG tablet TAKE 1 TABLET(2.5 MG) BY MOUTH DAILY Patient taking differently: 5 mg daily 04/21/16  Yes Jeffery, Chelle, PA-C  hydrochlorothiazide (MICROZIDE) 12.5 MG capsule TAKE 1 CAPSULE(12.5 MG) BY MOUTH DAILY 04/21/16  Yes Jeffery, Chelle, PA-C  Semaglutide (OZEMPIC) 1 MG/DOSE SOPN Inject into the skin.   Yes [provider]  naproxen (NAPROSYN) 500 MG tablet Take 1 tablet (500 mg total) by mouth 2 (two) times daily as needed for moderate pain. 01/17/17   Sudie Grumbling, NP     Family History Family History  Problem Relation Age of Onset  . Diabetes Father   . Stroke Father     Social History Social History  Substance Use Topics  . Smoking status: Never Smoker  . Smokeless tobacco: Never Used  . Alcohol use Yes     Allergies   Patient has no known allergies.   Review of Systems Review of Systems  Constitutional: Negative for appetite change, chills, fatigue and fever.  Cardiovascular: Negative for chest pain and leg swelling.  Gastrointestinal: Negative for nausea and vomiting.  Musculoskeletal: Positive for arthralgias, joint swelling and myalgias.  Skin: Positive for color change. Negative for rash and wound.  Allergic/Immunologic: Negative for immunocompromised state.  Neurological: Negative for dizziness, tremors, seizures, syncope, weakness, light-headedness, numbness and headaches.  Hematological: Negative for adenopathy. Does not bruise/bleed easily.     Physical Exam Triage Vital Signs ED Triage Vitals  Enc Vitals Group     BP 01/17/17 1225 (!) 134/91     Pulse Rate 01/17/17 1225 78     Resp 01/17/17 1225 16     Temp 01/17/17 1225 98.6 F (37 C)     Temp Source 01/17/17 1225 Oral     SpO2 01/17/17 1225 94 %     Weight  01/17/17 1226 228 lb (103.4 kg)     Height 01/17/17 1226  (1.676 m)     Head Circumference --      Peak Flow --      Pain Score 01/17/17 1241 6     Pain Loc --      Pain Edu? --      Excl. in GC? --    No data found.   Updated Vital Signs BP (!) 134/91 (BP Location: Right Arm)   Pulse 78   Temp 98.6 F (37 C) (Oral)   Resp 16   Ht  (1.676 m)   Wt 228 lb (103.4 kg)   SpO2 94%   BMI 36.80 kg/m   Visual Acuity Right Eye Distance:   Left Eye Distance:   Bilateral Distance:    Right Eye Near:   Left Eye Near:    Bilateral Near:     Physical Exam  Constitutional: She is oriented to person, place, and time. She appears well-developed and well-nourished. No distress.  HENT:  Head:  Normocephalic and atraumatic.  Eyes: Conjunctivae and EOM are normal.  Neck: Normal range of motion.  Cardiovascular: Normal rate.   Pulmonary/Chest: Effort normal.  Musculoskeletal: She exhibits edema and tenderness.       Left foot: There is decreased range of motion, tenderness and swelling. There is normal capillary refill, no crepitus, no deformity and no laceration.       Feet:  Slight swelling and bruising near the proximal end of her 5th metatarsal. Very tender. Has decreased range of motion of foot, especially with flexion and internal rotation. Good pulses and capillary refill. No neuro deficits noted.   Neurological: She is alert and oriented to person, place, and time. She has normal strength. No sensory deficit. She exhibits normal muscle tone. Gait (unable to fully bear weight) abnormal.  Skin: Skin is warm and dry. Capillary refill takes less than 2 seconds. No rash noted. No erythema.  Psychiatric: She has a normal mood and affect. Her behavior is normal. Judgment and thought content normal.     UC Treatments / Results  Labs (all labs ordered are listed, but only abnormal results are displayed) Labs Reviewed - No data to display  EKG  EKG Interpretation None       Radiology Dg Foot Complete Left  Result Date: 01/17/2017 CLINICAL DATA:  Slipped and fell on slippery 4 today. The patient is complaining of lateral foot pain over the fourth and fifth metatarsal. Patient is limping. EXAM: LEFT FOOT - COMPLETE 3+ VIEW COMPARISON:  None in PACs FINDINGS: The bones are subjectively adequately mineralized. No acute fracture or dislocation is observed. The joint spaces are well-maintained. Specific attention to the fourth and fifth metatarsals reveals no acute abnormality. The bones of the hindfoot are intact. There is a tiny plantar calcaneal spur. There is mild soft tissue swelling over the forefoot. IMPRESSION: There is no acute fracture nor dislocation of the bones of the  left foot. Electronically Signed   By: David  [place] M.D.   On: 01/17/2017 13:14    Procedures Procedures (including critical care time)  Medications Ordered in UC Medications - No data to display   Initial Impression / Assessment and Plan / UC Course  I have reviewed the triage vital signs and the nursing notes.  Pertinent labs & imaging results that were available during my care of the patient were reviewed by me and considered in my medical decision making (see chart  for details).    Reviewed x-ray results with patient- no distinct fracture. Discussed that she may have a mild foot sprain. Recommend wear ace wrap for support. May take Naproxen  twice a day as needed for pain and swelling. Keep foot elevated as much as possible. Follow-up with her PCP in 5 days if not improving.    Final Clinical Impressions(s) / UC Diagnoses   Final diagnoses:  Left foot pain  Sprain of left foot, initial encounter    New Prescriptions Discharge Medication List as of 01/17/2017  1:32 PM    START taking these medications   Details  naproxen (NAPROSYN) 500 MG tablet Take 1 tablet (500 mg total) by mouth 2 (two) times daily as needed for moderate pain., Starting Thu 01/17/2017, Normal         Controlled Substance Prescriptions [place] Controlled Substance Registry consulted? Not Applicable   Sudie Grumbling, NP 01/17/17 1709

## 2017-01-17 NOTE — Discharge Instructions (Signed)
Recommend start Naproxen  twice a day as directed for pain and swelling. Recommend wear ace wrap for support. Keep foot elevated as much as possible. Follow-up in 5 days with your PCP if not improving.

## 2017-06-06 ENCOUNTER — Other Ambulatory Visit: Payer: Self-pay | Admitting: Family Medicine

## 2017-06-06 ENCOUNTER — Other Ambulatory Visit (HOSPITAL_COMMUNITY)
Admission: RE | Admit: 2017-06-06 | Discharge: 2017-06-06 | Disposition: A | Discharge status: Home / Self Care | Payer: Managed Care, Other (non HMO) | Source: Ambulatory Visit | Attending: Family Medicine | Admitting: Family Medicine

## 2017-06-06 DIAGNOSIS — N760 Acute vaginitis: Secondary | ICD-10-CM | POA: Insufficient documentation

## 2017-06-06 DIAGNOSIS — Z113 Encounter for screening for infections with a predominantly sexual mode of transmission: Secondary | ICD-10-CM | POA: Diagnosis present

## 2017-06-12 LAB — URINE CYTOLOGY ANCILLARY ONLY
BACTERIAL VAGINITIS: NEGATIVE
Candida vaginitis: NEGATIVE
Chlamydia: NEGATIVE
Neisseria Gonorrhea: NEGATIVE
Trichomonas: NEGATIVE

## 2018-01-24 ENCOUNTER — Other Ambulatory Visit: Payer: Self-pay

## 2018-01-24 ENCOUNTER — Ambulatory Visit (HOSPITAL_COMMUNITY)
Admission: EM | Admit: 2018-01-24 | Discharge: 2018-01-24 | Disposition: A | Discharge status: Home / Self Care | Payer: Managed Care, Other (non HMO) | Attending: Family Medicine | Admitting: Family Medicine

## 2018-01-24 ENCOUNTER — Encounter (HOSPITAL_COMMUNITY): Payer: Self-pay | Admitting: Emergency Medicine

## 2018-01-24 DIAGNOSIS — Z79899 Other long term (current) drug therapy: Secondary | ICD-10-CM | POA: Insufficient documentation

## 2018-01-24 DIAGNOSIS — I10 Essential (primary) hypertension: Secondary | ICD-10-CM | POA: Diagnosis not present

## 2018-01-24 DIAGNOSIS — B370 Candidal stomatitis: Secondary | ICD-10-CM | POA: Diagnosis not present

## 2018-01-24 MED ORDER — FLUCONAZOLE 150 MG PO TABS: 150.0000 mg | ORAL_TABLET | Freq: Every day | ORAL | 0 refills | Status: DC

## 2018-01-24 NOTE — ED Provider Notes (Signed)
[facility]    CSN: [id number] Arrival date & time: 01/24/18  0805     History   Chief Complaint Chief Complaint  Patient presents with  . Oral Swelling    HPI Rebecca Moody is a 42 y.o. female.   She is a 42 year old female with past medical history of hypertension.  She presents with oral swelling that started yesterday while she was at work.  It started on the left side of her tongue and then moved to the right side of her tongue.  Reports started shortly after she ate lunch that consisted of a lean cuisine with broccoli.  She denies any allergies to any foods.  She also reports that a day prior to that she had oral sex.  Patient also has a history of thrush.  She does have white coating on tongue.  Reports the swelling has improved.  She did take a Benadryl last night which helped.  She denies any associated trouble swallowing trouble breathing but reports her throat is scratchy.  No fever, chills or URI symptoms.  She is concerned for oral STDs.   ROS per HPI      Past Medical History:  Diagnosis Date  . AMA (advanced maternal age) multigravida 35+   . H/O varicella   . Hypertension     Patient Active Problem List   Diagnosis Date Noted  . Accelerated hypertension 12/23/2013  . Normal pregnancy 09/27/2011    History reviewed. No pertinent surgical history.  OB History    Gravida  3   Para  2   Term  2   Preterm  0   AB  1   Living  2     SAB  1   TAB  0   Ectopic  0   Multiple  0   Live Births  2            Home Medications    Prior to Admission medications   Medication Sig Start Date End Date Taking? Authorizing Provider  amLODipine (NORVASC) 2.5 MG tablet TAKE 1 TABLET(2.5 MG) BY MOUTH DAILY Patient taking differently: 5 mg daily 04/21/16   Porfirio Oar, PA  fluconazole (DIFLUCAN) 150 MG tablet Take 1 tablet (150 mg total) by mouth daily. 01/24/18   Dahlia Byes A, NP  hydrochlorothiazide (MICROZIDE) 12.5 MG capsule  TAKE 1 CAPSULE(12.5 MG) BY MOUTH DAILY 04/21/16   Porfirio Oar, PA  naproxen (NAPROSYN) 500 MG tablet Take 1 tablet (500 mg total) by mouth 2 (two) times daily as needed for moderate pain. 01/17/17   Sudie Grumbling, NP  Semaglutide (OZEMPIC) 1 MG/DOSE SOPN Inject into the skin.    [provider]    Family History Family History  Problem Relation Age of Onset  . Diabetes Father   . Stroke Father     Social History Social History   Tobacco Use  . Smoking status: Never Smoker  . Smokeless tobacco: Never Used  Substance Use Topics  . Alcohol use: Yes  . Drug use: No     Allergies   Patient has no known allergies.   Review of Systems Review of Systems   Physical Exam Triage Vital Signs ED Triage Vitals [01/24/18 0827]  Enc Vitals Group     BP (!) 167/105     Pulse Rate 73     Resp      Temp 97.9 F (36.6 C)     Temp src      SpO2  100 %     Weight      Height      Head Circumference      Peak Flow      Pain Score 5     Pain Loc      Pain Edu?      Excl. in GC?    No data found.  Updated Vital Signs BP (!) 167/105 (BP Location: Right Arm)   Pulse 73   Temp 97.9 F (36.6 C)   SpO2 100%   Visual Acuity Right Eye Distance:   Left Eye Distance:   Bilateral Distance:    Right Eye Near:   Left Eye Near:    Bilateral Near:     Physical Exam  Constitutional: She is oriented to person, place, and time. She appears well-developed and well-nourished.  Very pleasant. Non toxic or ill appearing.   HENT:  Head: Normocephalic and atraumatic.  Thin white coating to tongue.  Mild swelling to right side of tongue.  No oral lesions.  No oropharyngeal erythema, tonsillar swelling or exudates. No lip swelling.   Eyes: Conjunctivae are normal.  Neck: Normal range of motion.  Cardiovascular: Normal rate, regular rhythm and normal heart sounds.  Pulmonary/Chest: Effort normal and breath sounds normal.  Lungs clear in all fields. No dyspnea or distress.  No retractions or nasal flaring.   Musculoskeletal: Normal range of motion.  Neurological: She is alert and oriented to person, place, and time.  Skin: Skin is warm and dry.  Psychiatric: She has a normal mood and affect.  Nursing note and vitals reviewed.    UC Treatments / Results  Labs (all labs ordered are listed, but only abnormal results are displayed) Labs Reviewed  CYTOLOGY, (ORAL, ANAL, URETHRAL) ANCILLARY ONLY    EKG None  Radiology No results found.  Procedures Procedures (including critical care time)  Medications Ordered in UC Medications - No data to display  Initial Impression / Assessment and Plan / UC Course  I have reviewed the triage vital signs and the nursing notes.  Pertinent labs & imaging results that were available during my care of the patient were reviewed by me and considered in my medical decision making (see chart for details).     We will go ahead and treat thrush with fluconazole. Oral swab sent for STD testing Results pending Final Clinical Impressions(s) / UC Diagnoses   Final diagnoses:  Thrush, oral     Discharge Instructions     We will go ahead and treat you for thrush with fluconazole. We sent the swab for testing We will call with any positive results     ED Prescriptions    Medication Sig Dispense Auth. Provider   fluconazole (DIFLUCAN) 150 MG tablet Take 1 tablet (150 mg total) by mouth daily. 2 tablet Dahlia Byes A, NP     Controlled Substance Prescriptions [place] Controlled Substance Registry consulted? Not Applicable   Janace Aris, NP 01/24/18 [phone]

## 2018-01-24 NOTE — ED Triage Notes (Signed)
Pt reports eating lunch yesterday then feeling her tongue and cheeks swell.  She states she took a Benadryl that decreased the swelling but she states it still feels weird today.

## 2018-01-24 NOTE — Discharge Instructions (Addendum)
We will go ahead and treat you for thrush with fluconazole. We sent the swab for testing We will call with any positive results

## 2018-01-27 LAB — CYTOLOGY, (ORAL, ANAL, URETHRAL) ANCILLARY ONLY
Bacterial vaginitis: NEGATIVE
CHLAMYDIA, DNA PROBE: NEGATIVE
Candida vaginitis: NEGATIVE
Neisseria Gonorrhea: NEGATIVE
Trichomonas: NEGATIVE

## 2018-03-20 ENCOUNTER — Ambulatory Visit (HOSPITAL_COMMUNITY)
Admission: EM | Admit: 2018-03-20 | Discharge: 2018-03-20 | Disposition: A | Discharge status: Home / Self Care | Payer: 59 | Attending: Family Medicine | Admitting: Family Medicine

## 2018-03-20 ENCOUNTER — Encounter (HOSPITAL_COMMUNITY): Payer: Self-pay | Admitting: Emergency Medicine

## 2018-03-20 DIAGNOSIS — W108XXA Fall (on) (from) other stairs and steps, initial encounter: Secondary | ICD-10-CM

## 2018-03-20 DIAGNOSIS — S0990XA Unspecified injury of head, initial encounter: Secondary | ICD-10-CM | POA: Insufficient documentation

## 2018-03-20 MED ORDER — BUTALBITAL-APAP-CAFFEINE 50-325-40 MG PO TABS: 1.0000 | ORAL_TABLET | Freq: Four times a day (QID) | ORAL | 0 refills | Status: DC | PRN

## 2018-03-20 NOTE — ED Triage Notes (Signed)
[facility] fall last night that left her with a lump to her right forehead.  Pt c/o headaches, denies LOC.

## 2018-03-20 NOTE — ED Provider Notes (Signed)
[facility]    CSN: [id number] Arrival date & time: 03/20/18  0815     History   Chief Complaint Chief Complaint  Patient presents with  . Fall    HPI Rebecca Moody is a 42 y.o. female.   Is a 42 year old established [place] urgent care patient.  [facility] steps and fall last night that left her with a lump to her right forehead.  Pt c/o headache where she hit, denies LOC.     Past Medical History:  Diagnosis Date  . AMA (advanced maternal age) multigravida 35+   . H/O varicella   . Hypertension     Patient Active Problem List   Diagnosis Date Noted  . Accelerated hypertension 12/23/2013  . Normal pregnancy 09/27/2011    History reviewed. No pertinent surgical history.  OB History    Gravida  3   Para  2   Term  2   Preterm  0   AB  1   Living  2     SAB  1   TAB  0   Ectopic  0   Multiple  0   Live Births  2            Home Medications    Prior to Admission medications   Medication Sig Start Date End Date Taking? Authorizing Provider  amLODipine (NORVASC) 2.5 MG tablet TAKE 1 TABLET(2.5 MG) BY MOUTH DAILY Patient taking differently: 5 mg daily 04/21/16   Porfirio OarJeffery, Chelle, PA  butalbital-acetaminophen-caffeine (FIORICET, ESGIC) [phone] MG tablet Take 1-2 tablets by mouth every 6 (six) hours as needed for headache. 03/20/18 03/20/19  Elvina SidleLauenstein, Kiffany Schelling, MD  hydrochlorothiazide (MICROZIDE) 12.5 MG capsule TAKE 1 CAPSULE(12.5 MG) BY MOUTH DAILY 04/21/16   Jeffery, Avelino Leedshelle, PA  Semaglutide (OZEMPIC) 1 MG/DOSE SOPN Inject into the skin.    [provider]    Family History Family History  Problem Relation Age of Onset  . Diabetes Father   . Stroke Father     Social History Social History   Tobacco Use  . Smoking status: Never Smoker  . Smokeless tobacco: Never Used  Substance Use Topics  . Alcohol use: Yes  . Drug use: No     Allergies   Patient has no known  allergies.   Review of Systems Review of Systems  HENT: Negative.   Respiratory: Negative.   Cardiovascular: Negative.   Gastrointestinal: Negative.   Neurological: Positive for headaches. Negative for dizziness.  All other systems reviewed and are negative.    Physical Exam Triage Vital Signs ED Triage Vitals [03/20/18 0823]  Enc Vitals Group     BP (!) 143/88     Pulse Rate 86     Resp 16     Temp 97.9 F (36.6 C)     Temp Source Oral     SpO2 97 %     Weight      Height      Head Circumference      Peak Flow      Pain Score 7     Pain Loc      Pain Edu?      Excl. in GC?    No data found.  Updated Vital Signs BP (!) 143/88 (BP Location: Left Arm)   Pulse 86   Temp 97.9 F (36.6 C) (Oral)   Resp 16   SpO2 97%   Physical Exam Vitals signs and nursing  note reviewed.  Constitutional:      Appearance: Normal appearance.  HENT:     Head:     Comments: Mild right frontal swelling without bony abnormality or laceration    Right Ear: Tympanic membrane and ear canal normal.     Left Ear: Tympanic membrane and ear canal normal.     Nose: Nose normal.     Mouth/Throat:     Mouth: Mucous membranes are moist.  Eyes:     Extraocular Movements: Extraocular movements intact.     Conjunctiva/sclera: Conjunctivae normal.     Pupils: Pupils are equal, round, and reactive to light.  Cardiovascular:     Rate and Rhythm: Normal rate and regular rhythm.  Pulmonary:     Effort: Pulmonary effort is normal.     Breath sounds: Normal breath sounds.  Musculoskeletal: Normal range of motion.  Skin:    General: Skin is warm and dry.  Neurological:     Mental Status: She is alert.     Cranial Nerves: No cranial nerve deficit.     Sensory: No sensory deficit.     Motor: No weakness.     Coordination: Coordination normal.     Gait: Gait normal.  Psychiatric:        Mood and Affect: Mood normal.        Thought Content: Thought content normal.      UC Treatments /  Results  Labs (all labs ordered are listed, but only abnormal results are displayed) Labs Reviewed - No data to display  EKG None  Radiology No results found.  Procedures Procedures (including critical care time)  Medications Ordered in UC Medications - No data to display  Initial Impression / Assessment and Plan / UC Course  I have reviewed the triage vital signs and the nursing notes.  Pertinent labs & imaging results that were available during my care of the patient were reviewed by me and considered in my medical decision making (see chart for details).    Final Clinical Impressions(s) / UC Diagnoses   Final diagnoses:  Minor head injury, initial encounter     Discharge Instructions     Headache should resolve by tomorrow.    ED Prescriptions    Medication Sig Dispense Auth. Provider   butalbital-acetaminophen-caffeine (FIORICET, ESGIC) 50-325-40 MG tablet Take 1-2 tablets by mouth every 6 (six) hours as needed for headache. 20 tablet Elvina Sidle, MD     Controlled Substance Prescriptions [place] Controlled Substance Registry consulted? Not Applicable   Elvina Sidle, MD 03/20/18 [phone]

## 2018-03-20 NOTE — Discharge Instructions (Addendum)
Headache should resolve by tomorrow.

## 2018-04-21 ENCOUNTER — Ambulatory Visit: Payer: 59 | Admitting: Registered"

## 2018-07-07 ENCOUNTER — Other Ambulatory Visit: Payer: Self-pay

## 2018-07-07 ENCOUNTER — Encounter (HOSPITAL_COMMUNITY): Payer: Self-pay

## 2018-07-07 ENCOUNTER — Ambulatory Visit (HOSPITAL_COMMUNITY)
Admission: EM | Admit: 2018-07-07 | Discharge: 2018-07-07 | Disposition: A | Discharge status: Home / Self Care | Payer: 59 | Attending: Family Medicine | Admitting: Family Medicine

## 2018-07-07 DIAGNOSIS — R197 Diarrhea, unspecified: Secondary | ICD-10-CM | POA: Insufficient documentation

## 2018-07-07 MED ORDER — DICYCLOMINE HCL 20 MG PO TABS: 20.0000 mg | ORAL_TABLET | Freq: Four times a day (QID) | ORAL | 0 refills | Status: DC

## 2018-07-07 NOTE — ED Triage Notes (Signed)
Pt cc she has stomach pain and diarrhea x 3 days. Pt has tried the OTC med's for stomach pain. And nothing is working.

## 2018-07-07 NOTE — Discharge Instructions (Addendum)
You can take the bentyl 2 - 4 times a day as needed.  Try to bring back a stool sample for testing.

## 2018-07-07 NOTE — ED Notes (Signed)
Patient verbalizes understanding of discharge instructions. Opportunity for questioning and answers were provided. Patient discharged [facility] by [facility].  

## 2018-07-07 NOTE — ED Provider Notes (Addendum)
[facility]    CSN: [id number] Arrival date & time: 07/07/18  0815     History   Chief Complaint Chief Complaint  Patient presents with  . Abdominal Pain    HPI Rebecca Moody is a 43 y.o. female.   Pt is a 43 year old female that presents with diarrhea x 3 days. This has been intermittent with associated abdominal cramping that she reports as severe. Generalized abdominal tenderness. Eating makes the problem worse. She has been drinking fluids.  She has been trying Pepto and Tums for her symptoms without any relief. She denies any fever, chills, N,V,constiaption ,rectal bleeding. No known recent sick contacts. No recent travels. No recent abx use.    ROS per HPI      Past Medical History:  Diagnosis Date  . AMA (advanced maternal age) multigravida 35+   . H/O varicella   . Hypertension     Patient Active Problem List   Diagnosis Date Noted  . Accelerated hypertension 12/23/2013  . Normal pregnancy 09/27/2011    History reviewed. No pertinent surgical history.  OB History    Gravida  3   Para  2   Term  2   Preterm  0   AB  1   Living  2     SAB  1   TAB  0   Ectopic  0   Multiple  0   Live Births  2            Home Medications    Prior to Admission medications   Medication Sig Start Date End Date Taking? Authorizing Provider  amLODipine (NORVASC) 2.5 MG tablet TAKE 1 TABLET(2.5 MG) BY MOUTH DAILY Patient taking differently: 5 mg daily 04/21/16   Porfirio Oar, PA  butalbital-acetaminophen-caffeine (FIORICET, ESGIC) [phone] MG tablet Take 1-2 tablets by mouth every 6 (six) hours as needed for headache. 03/20/18 03/20/19  Elvina Sidle, MD  dicyclomine (BENTYL) 20 MG tablet Take 1 tablet (20 mg total) by mouth 4 (four) times daily. 07/07/18   Jimeka Balan, Gloris Manchester A, NP  hydrochlorothiazide (MICROZIDE) 12.5 MG capsule TAKE 1 CAPSULE(12.5 MG) BY MOUTH DAILY 04/21/16   Jeffery, Avelino Leeds, PA  Semaglutide (OZEMPIC) 1 MG/DOSE SOPN Inject  into the skin.    [provider]    Family History Family History  Problem Relation Age of Onset  . Diabetes Father   . Stroke Father     Social History Social History   Tobacco Use  . Smoking status: Never Smoker  . Smokeless tobacco: Never Used  Substance Use Topics  . Alcohol use: Yes  . Drug use: No     Allergies   Patient has no known allergies.   Review of Systems Review of Systems   Physical Exam Triage Vital Signs ED Triage Vitals [07/07/18 0833]  Enc Vitals Group     BP 110/77     Pulse Rate 67     Resp 18     Temp 98.3 F (36.8 C)     Temp Source Oral     SpO2 98 %     Weight 189 lb (85.7 kg)     Height      Head Circumference      Peak Flow      Pain Score 9     Pain Loc      Pain Edu?      Excl. in GC?    No data found.  Updated Vital Signs BP 110/77 (  BP Location: Left Arm)   Pulse 67   Temp 98.3 F (36.8 C) (Oral)   Resp 18   Wt 189 lb (85.7 kg)   LMP 06/17/2018   SpO2 98%   BMI 30.51 kg/m   Visual Acuity Right Eye Distance:   Left Eye Distance:   Bilateral Distance:    Right Eye Near:   Left Eye Near:    Bilateral Near:     Physical Exam Vitals signs and nursing note reviewed.  Constitutional:      General: She is not in acute distress.    Appearance: Normal appearance. She is not ill-appearing, toxic-appearing or diaphoretic.  HENT:     Head: Normocephalic.     Nose: Nose normal.     Mouth/Throat:     Pharynx: Oropharynx is clear.  Eyes:     Conjunctiva/sclera: Conjunctivae normal.  Neck:     Musculoskeletal: Normal range of motion.  Cardiovascular:     Rate and Rhythm: Normal rate and regular rhythm.  Pulmonary:     Effort: Pulmonary effort is normal.  Abdominal:     Palpations: Abdomen is soft.     Tenderness: There is generalized abdominal tenderness.  Musculoskeletal: Normal range of motion.  Skin:    General: Skin is warm and dry.     Findings: No rash.  Neurological:     Mental Status:  She is alert.  Psychiatric:        Mood and Affect: Mood normal.      UC Treatments / Results  Labs (all labs ordered are listed, but only abnormal results are displayed) Labs Reviewed - No data to display  EKG None  Radiology No results found.  Procedures Procedures (including critical care time)  Medications Ordered in UC Medications - No data to display  Initial Impression / Assessment and Plan / UC Course  I have reviewed the triage vital signs and the nursing notes.  Pertinent labs & imaging results that were available during my care of the patient were reviewed by me and considered in my medical decision making (see chart for details).    Diarrhea- presumed infectious.  Obtaining stool sample for testing.  Pt unable to give a sample in clinic Instructed to bring back.  Prescribing Bentyl for symptoms to use as needed.   Final Clinical Impressions(s) / UC Diagnoses   Final diagnoses:  Diarrhea, unspecified type     Discharge Instructions     You can take the bentyl 2 - 4 times a day as needed.  Try to bring back a stool sample for testing.      ED Prescriptions    Medication Sig Dispense Auth. Provider   dicyclomine (BENTYL) 20 MG tablet Take 1 tablet (20 mg total) by mouth 4 (four) times daily. 20 tablet Dahlia Byes A, NP     Controlled Substance Prescriptions [place] Controlled Substance Registry consulted? Not Applicable        Janace Aris, NP 07/07/18 [phone]

## 2018-07-09 ENCOUNTER — Telehealth (HOSPITAL_COMMUNITY): Payer: Self-pay

## 2018-07-09 ENCOUNTER — Telehealth (HOSPITAL_COMMUNITY): Payer: Self-pay | Admitting: Family Medicine

## 2018-07-09 LAB — GASTROINTESTINAL PANEL BY PCR, STOOL (REPLACES STOOL CULTURE)
Adenovirus F40/41: NOT DETECTED
Astrovirus: NOT DETECTED
CYCLOSPORA CAYETANENSIS: NOT DETECTED
Campylobacter species: NOT DETECTED
Cryptosporidium: NOT DETECTED
ENTAMOEBA HISTOLYTICA: NOT DETECTED
ENTEROTOXIGENIC E COLI (ETEC): NOT DETECTED
Enteroaggregative E coli (EAEC): NOT DETECTED
Enteropathogenic E coli (EPEC): NOT DETECTED
Giardia lamblia: NOT DETECTED
Norovirus GI/GII: NOT DETECTED
Plesimonas shigelloides: NOT DETECTED
Rotavirus A: NOT DETECTED
SAPOVIRUS (I, II, IV, AND V): NOT DETECTED
SHIGA LIKE TOXIN PRODUCING E COLI (STEC): NOT DETECTED
Salmonella species: NOT DETECTED
Shigella/Enteroinvasive E coli (EIEC): DETECTED — AB
VIBRIO CHOLERAE: NOT DETECTED
VIBRIO SPECIES: NOT DETECTED
Yersinia enterocolitica: NOT DETECTED

## 2018-07-09 MED ORDER — CIPROFLOXACIN HCL 500 MG PO TABS: 500.0000 mg | ORAL_TABLET | Freq: Two times a day (BID) | ORAL | 0 refills | Status: AC

## 2018-07-09 NOTE — Telephone Encounter (Signed)
Spoke with pt about results She is still having watery diarrhea and this is day 6 or 7 Reports the cramping has some what improved.  We will go ahead and treat with cipro 500 mg 2 x day for 5 days.  Pt questions answered and everything understood.

## 2018-07-18 ENCOUNTER — Encounter (HOSPITAL_COMMUNITY): Payer: Self-pay | Admitting: Emergency Medicine

## 2018-07-18 ENCOUNTER — Ambulatory Visit (HOSPITAL_COMMUNITY)
Admission: EM | Admit: 2018-07-18 | Discharge: 2018-07-18 | Disposition: A | Discharge status: Home / Self Care | Payer: 59 | Attending: Emergency Medicine | Admitting: Emergency Medicine

## 2018-07-18 ENCOUNTER — Other Ambulatory Visit: Payer: Self-pay

## 2018-07-18 DIAGNOSIS — B370 Candidal stomatitis: Secondary | ICD-10-CM | POA: Diagnosis present

## 2018-07-18 DIAGNOSIS — R197 Diarrhea, unspecified: Secondary | ICD-10-CM | POA: Diagnosis present

## 2018-07-18 DIAGNOSIS — N760 Acute vaginitis: Secondary | ICD-10-CM | POA: Diagnosis present

## 2018-07-18 MED ORDER — FLUCONAZOLE 150 MG PO TABS: ORAL_TABLET | ORAL | 0 refills | Status: DC

## 2018-07-18 NOTE — ED Triage Notes (Signed)
Pt states two weeks ago she had a GI panel done and was given antibiotics, states she is somewhat better but still having diarrhea. Also worried about a yeast infection and possible trich. No pain at this time.

## 2018-07-18 NOTE — ED Provider Notes (Signed)
[facility]    CSN: [id number] Arrival date & time: 07/18/18  8295     History   Chief Complaint Chief Complaint  Patient presents with  . Vaginitis  . Diarrhea    HPI Rebecca Moody is a 43 y.o. female.   Rebecca Moody presents with complaints of vaginal irritation, feels dry and sore, after recent increased white discharge. Now on her period. Started a few days ago. Feels she also has irritation to tongue and roof of mouth which feels "irritated. Was seen 3/30 for diarrhea with gi panel positive for shigella, was given cipro to treat this. She has completed this. Symptoms started following antibiotics. Diarrhea has improved, but still having approximately 2-3 loose stools a day. No blood or black in stool. No abdominal pain. No nausea or vomiting. No fevers. She has been eating and drinking. She feels anxious about possible trichomonas. She was treated for this after she contracted it following an encounter with a new partner. Has since had intercourse with her steady partner, and has been using condoms. She had not told that partner of her treatment. States she was screened for HIV recently which was negative as well. No urinary symptoms. No pelvic pain. Hx of htn.     ROS per HPI, negative if not otherwise mentioned.      Past Medical History:  Diagnosis Date  . AMA (advanced maternal age) multigravida 35+   . H/O varicella   . Hypertension     Patient Active Problem List   Diagnosis Date Noted  . Accelerated hypertension 12/23/2013  . Normal pregnancy 09/27/2011    History reviewed. No pertinent surgical history.  OB History    Gravida  3   Para  2   Term  2   Preterm  0   AB  1   Living  2     SAB  1   TAB  0   Ectopic  0   Multiple  0   Live Births  2            Home Medications    Prior to Admission medications   Medication Sig Start Date End Date Taking? Authorizing Provider  amLODipine (NORVASC) 2.5 MG tablet TAKE  1 TABLET(2.5 MG) BY MOUTH DAILY Patient taking differently: 5 mg daily 04/21/16   Porfirio Oar, PA  butalbital-acetaminophen-caffeine (FIORICET, ESGIC) [phone] MG tablet Take 1-2 tablets by mouth every 6 (six) hours as needed for headache. 03/20/18 03/20/19  Elvina Sidle, MD  dicyclomine (BENTYL) 20 MG tablet Take 1 tablet (20 mg total) by mouth 4 (four) times daily. 07/07/18   Dahlia Byes A, NP  fluconazole (DIFLUCAN) 150 MG tablet Take 1 tablet today. If still with symptoms may repeat in 3 days. 07/18/18   Georgetta Haber, NP  hydrochlorothiazide (MICROZIDE) 12.5 MG capsule TAKE 1 CAPSULE(12.5 MG) BY MOUTH DAILY 04/21/16   Jeffery, Avelino Leeds, PA  Semaglutide (OZEMPIC) 1 MG/DOSE SOPN Inject into the skin.    [provider]    Family History Family History  Problem Relation Age of Onset  . Diabetes Father   . Stroke Father     Social History Social History   Tobacco Use  . Smoking status: Never Smoker  . Smokeless tobacco: Never Used  Substance Use Topics  . Alcohol use: Yes  . Drug use: No     Allergies   Patient has no known allergies.   Review of Systems Review of Systems   Physical  Exam Triage Vital Signs ED Triage Vitals [07/18/18 0855]  Enc Vitals Group     BP (!) 140/101     Pulse Rate 74     Resp 18     Temp 98.1 F (36.7 C)     Temp src      SpO2 100 %     Weight      Height      Head Circumference      Peak Flow      Pain Score 0     Pain Loc      Pain Edu?      Excl. in GC?    No data found.  Updated Vital Signs BP (!) 140/101   Pulse 74   Temp 98.1 F (36.7 C)   Resp 18   LMP 07/18/2018   SpO2 100%    Physical Exam Constitutional:      General: She is not in acute distress.    Appearance: She is well-developed.  HENT:     Head:     Comments: White coating noted to tongue and roof of mouth Cardiovascular:     Rate and Rhythm: Normal rate and regular rhythm.     Heart sounds: Normal heart sounds.  Pulmonary:      Effort: Pulmonary effort is normal.     Breath sounds: Normal breath sounds.  Abdominal:     General: There is no distension.     Palpations: Abdomen is soft. Abdomen is not rigid.     Tenderness: There is no abdominal tenderness. There is no guarding or rebound.  Genitourinary:    Comments: Denies sores, lesions; currently menstruating; no pelvic pain; gu exam deferred at this time, vaginal self swab collected.   Skin:    General: Skin is warm and dry.  Neurological:     Mental Status: She is alert and oriented to person, place, and time.      UC Treatments / Results  Labs (all labs ordered are listed, but only abnormal results are displayed) Labs Reviewed  CERVICOVAGINAL ANCILLARY ONLY    EKG None  Radiology No results found.  Procedures Procedures (including critical care time)  Medications Ordered in UC Medications - No data to display  Initial Impression / Assessment and Plan / UC Course  I have reviewed the triage vital signs and the nursing notes.  Pertinent labs & imaging results that were available during my care of the patient were reviewed by me and considered in my medical decision making (see chart for details).     Patient concerned about possible trichomanas, has been using condoms. Was on cipro immediately prior to onset of vaginal discomfort. Will treat for yeast vaginitis with vaginal swab pending. Safe sex discussed. Will notify of any positive findings and if any changes to treatment are needed.  If symptoms worsen or do not improve in the next week to return to be seen or to follow up with PCP.  Patient verbalized understanding and agreeable to plan.   Final Clinical Impressions(s) / UC Diagnoses   Final diagnoses:  Acute vaginitis  Thrush  Diarrhea in adult patient     Discharge Instructions     Diflucan for yeast, take one tab today and if still with symptoms take another in 3 days.  Probiotic may also be helpful.  May use pepto bismol  as needed for diarrhea.  Drink plenty of fluids to ensure adequate hydration.  Will notify you of any positive findings and if  any changes to treatment are needed.  You may monitor your results on your MyChart online as well.     ED Prescriptions    Medication Sig Dispense Auth. Provider   fluconazole (DIFLUCAN) 150 MG tablet Take 1 tablet today. If still with symptoms may repeat in 3 days. 2 tablet Georgetta HaberBurky, Anay Walter B, NP     Controlled Substance Prescriptions [place] Controlled Substance Registry consulted? Not Applicable   Georgetta HaberBurky, Christon Gallaway B, NP 07/18/18 1019

## 2018-07-18 NOTE — Discharge Instructions (Signed)
Diflucan for yeast, take one tab today and if still with symptoms take another in 3 days.  Probiotic may also be helpful.  May use pepto bismol as needed for diarrhea.  Drink plenty of fluids to ensure adequate hydration.  Will notify you of any positive findings and if any changes to treatment are needed.  You may monitor your results on your MyChart online as well.

## 2018-07-22 LAB — CERVICOVAGINAL ANCILLARY ONLY
Bacterial vaginitis: NEGATIVE
Candida vaginitis: NEGATIVE
Chlamydia: NEGATIVE
Neisseria Gonorrhea: NEGATIVE
Trichomonas: NEGATIVE

## 2018-10-17 ENCOUNTER — Encounter (HOSPITAL_COMMUNITY): Payer: Self-pay

## 2018-10-17 ENCOUNTER — Ambulatory Visit (INDEPENDENT_AMBULATORY_CARE_PROVIDER_SITE_OTHER): Payer: 59

## 2018-10-17 ENCOUNTER — Ambulatory Visit (HOSPITAL_COMMUNITY)
Admission: EM | Admit: 2018-10-17 | Discharge: 2018-10-17 | Disposition: A | Discharge status: Home / Self Care | Payer: 59 | Attending: Family Medicine | Admitting: Family Medicine

## 2018-10-17 ENCOUNTER — Other Ambulatory Visit: Payer: Self-pay

## 2018-10-17 DIAGNOSIS — R2242 Localized swelling, mass and lump, left lower limb: Secondary | ICD-10-CM

## 2018-10-17 NOTE — Discharge Instructions (Addendum)
No change in activity required Call your PCP to get referral to orthopedic

## 2018-10-17 NOTE — ED Triage Notes (Signed)
Patient presents to Urgent Care with complaints of left knee pain since about a week ago. Patient reports it is tender medially when she straightens her leg and presses on it. Deformity noted, hardened area to medial knee.

## 2018-10-17 NOTE — ED Provider Notes (Signed)
[name]    CSN: [id number] Arrival date & time: 10/17/18  4098      History   Chief Complaint Chief Complaint  Patient presents with  . Knee Pain    Left    HPI Rebecca Moody is a 43 y.o. female.   HPI  Patient is here for swelling of her left knee.  She states that she is noticed it recently.  She states it is mildly tender.  She does exercise a lot.  Does not hurt with walking or with exercise.  She is lost over 80 pounds in the last 2 years.  She never noticed this asymmetry of her knees before.  No trauma.  No injury.  No fall.  Past Medical History:  Diagnosis Date  . AMA (advanced maternal age) multigravida 48+   . H/O varicella   . Hypertension     Patient Active Problem List   Diagnosis Date Noted  . Accelerated hypertension 12/23/2013  . Normal pregnancy 09/27/2011    History reviewed. No pertinent surgical history.  OB History    Gravida  3   Para  2   Term  2   Preterm  0   AB  1   Living  2     SAB  1   TAB  0   Ectopic  0   Multiple  0   Live Births  2            Home Medications    Prior to Admission medications   Medication Sig Start Date End Date Taking? Authorizing Provider  amLODipine (NORVASC) 2.5 MG tablet TAKE 1 TABLET(2.5 MG) BY MOUTH DAILY Patient taking differently: 5 mg daily 04/21/16  Yes Jeffery, Chelle, PA  hydrochlorothiazide (MICROZIDE) 12.5 MG capsule TAKE 1 CAPSULE(12.5 MG) BY MOUTH DAILY 04/21/16   Jeffery, Domingo Mend, PA  Semaglutide (OZEMPIC) 1 MG/DOSE SOPN Inject into the skin.    [provider]  dicyclomine (BENTYL) 20 MG tablet Take 1 tablet (20 mg total) by mouth 4 (four) times daily. 07/07/18 10/17/18  Orvan July, NP    Family History Family History  Problem Relation Age of Onset  . Diabetes Father   . Stroke Father   . Healthy Mother     Social History Social History   Tobacco Use  . Smoking status: Current Some Day Smoker    Types: Cigars  . Smokeless tobacco:  Never Used  Substance Use Topics  . Alcohol use: Yes    Comment: occasionally  . Drug use: No     Allergies   Patient has no known allergies.   Review of Systems Review of Systems  Constitutional: Negative for chills and fever.  HENT: Negative for ear pain and sore throat.   Eyes: Negative for pain and visual disturbance.  Respiratory: Negative for cough and shortness of breath.   Cardiovascular: Negative for chest pain and palpitations.  Gastrointestinal: Negative for abdominal pain and vomiting.  Genitourinary: Negative for dysuria and hematuria.  Musculoskeletal: Positive for joint swelling. Negative for arthralgias and back pain.  Skin: Negative for color change and rash.  Neurological: Negative for seizures and syncope.  All other systems reviewed and are negative.    Physical Exam Triage Vital Signs ED Triage Vitals  Enc Vitals Group     BP 10/17/18 0858 137/90     Pulse Rate 10/17/18 0858 68     Resp 10/17/18 0858 17     Temp 10/17/18 0858 98.5 F (  36.9 C)     Temp Source 10/17/18 0858 Oral     SpO2 10/17/18 0858 100 %     Weight --      Height --      Head Circumference --      Peak Flow --      Pain Score 10/17/18 0854 2     Pain Loc --      Pain Edu? --      Excl. in GC? --    No data found.  Updated Vital Signs BP 137/90 (BP Location: Right Arm)   Pulse 68   Temp 98.5 F (36.9 C) (Oral)   Resp 17   LMP 10/17/2018   SpO2 100%      Physical Exam Constitutional:      General: She is not in acute distress.    Appearance: She is well-developed.  HENT:     Head: Normocephalic and atraumatic.  Eyes:     Conjunctiva/sclera: Conjunctivae normal.     Pupils: Pupils are equal, round, and reactive to light.  Neck:     Musculoskeletal: Normal range of motion.  Cardiovascular:     Rate and Rhythm: Normal rate.  Pulmonary:     Effort: Pulmonary effort is normal. No respiratory distress.  Abdominal:     General: There is no distension.      Palpations: Abdomen is soft.  Musculoskeletal: Normal range of motion.        General: Tenderness and deformity present. No swelling.       Legs:  Skin:    General: Skin is warm and dry.  Neurological:     Mental Status: She is alert.     Gait: Gait normal.     Deep Tendon Reflexes: Reflexes normal.  Psychiatric:        Mood and Affect: Mood normal.        Behavior: Behavior normal.      UC Treatments / Results  Labs (all labs ordered are listed, but only abnormal results are displayed) Labs Reviewed - No data to display  EKG   Radiology Dg Knee Ap/lat W/sunrise Left  Result Date: 10/17/2018 CLINICAL DATA:  Medial LEFT knee pain for 1 week, question over-extension during workout, pain soreness and swelling, hurts to flex and extend EXAM: LEFT KNEE 3 VIEWS COMPARISON:  None. FINDINGS: Osseous mineralization normal. Mild joint space narrowing. No acute fracture, dislocation or bone destruction. Tiny patellar spur at quadriceps tendon insertion. No joint effusion. IMPRESSION: No acute abnormalities. Electronically Signed   By: Ulyses SouthwardMark  Boles M.D.   On: 10/17/2018 10:14  I discussed the x-ray with Dr. Barnabas ListerWashington.  Working with Dr. Pia MauBowles today.  He states that he does see, as I do, soft tissue swelling adjacent to the medial condyle.  Smooth.  Overall.  Unclear etiology  Procedures Procedures (including critical care time)  Medications Ordered in UC Medications - No data to display  Initial Impression / Assessment and Plan / UC Course  I have reviewed the triage vital signs and the nursing notes.  Pertinent labs & imaging results that were available during my care of the patient were reviewed by me and considered in my medical decision making (see chart for details).      Final Clinical Impressions(s) / UC Diagnoses   Final diagnoses:  Mass of left knee     Discharge Instructions     No change in activity required Call your PCP to get referral to orthopedic   ED  Prescriptions    None     Controlled Substance Prescriptions [place] Controlled Substance Registry consulted? Not Applicable   Eustace MooreNelson,  Sue, MD 10/17/18 1118

## 2018-10-22 ENCOUNTER — Other Ambulatory Visit: Payer: Self-pay

## 2018-10-22 ENCOUNTER — Ambulatory Visit (INDEPENDENT_AMBULATORY_CARE_PROVIDER_SITE_OTHER): Payer: 59 | Admitting: Orthopaedic Surgery

## 2018-10-22 DIAGNOSIS — R2242 Localized swelling, mass and lump, left lower limb: Secondary | ICD-10-CM

## 2018-10-22 DIAGNOSIS — M25562 Pain in left knee: Secondary | ICD-10-CM

## 2018-10-22 NOTE — Progress Notes (Signed)
Office Visit Note   Patient: Rebecca Moody           Date of Birth: [birth year]           MRN: [id number] Visit Date: 10/22/2018              Requested by: Lucianne Lei, [address] [place],  [place] [address] PCP: Lucianne Lei, MD   Assessment & Plan: Visit Diagnoses:  1. Mass of left knee     Plan: I am certainly concerned about what I see on her left knee exam and is worrisome enough to obtain an MRI with contrast to assess the soft tissue mass to rule out this being more significant pathology.  We will see her back once we have this MRI obtained.  All question concerns were answered and addressed.  Follow-Up Instructions: Follow-up will be after the MRI is obtained.  Orders:  No orders of the defined types were placed in this encounter.  No orders of the defined types were placed in this encounter.     Procedures: No procedures performed   Clinical Data: No additional findings.   Subjective: Chief Complaint  Patient presents with  . Left Knee - Pain  The patient is a very pleasant and active 43 year old female who comes in for evaluation treatment of left knee pain and a mass that was noticed on the medial aspect of her left knee.  She had not noticed this before but is been working out and performing squats with just from body weight when she did notice a significant difference and the contours around her left knee comparing the left and right knees and a mass that she can feel in this area.  She denies any pain at night and has not had any recent illnesses or sick feeling.  She denies any masses in her body or noticing this before but is been starting to hurt over a week and a half.  She went to the emergency room and x-rays were obtained that are here for my review today.  HPI  Review of Systems She currently denies any headache, chest pain, shortness of breath, fever, chills, nausea, vomiting  Objective: Vital Signs: LMP 10/17/2018   Physical Exam  She is alert oriented x3 and in no acute distress Ortho Exam Examination of both knees are obtained easily.  When she externally rotates both her feet and knees at once I can easily see a large mass area on the medial aspect of her left knee that is not present on the right side.  I can even palpate a mass in this area.  It is not warm and there is no skin changes.  There is no knee joint effusion but there is an obvious soft tissue mass that is sizable along the left medial knee that is not present on the right knee. Specialty Comments:  No specialty comments available.  Imaging: No results found.   PMFS History: Patient Active Problem List   Diagnosis Date Noted  . Accelerated hypertension 12/23/2013  . Normal pregnancy 09/27/2011   Past Medical History:  Diagnosis Date  . AMA (advanced maternal age) multigravida 58+   . H/O varicella   . Hypertension     Family History  Problem Relation Age of Onset  . Diabetes Father   . Stroke Father   . Healthy Mother     No past surgical history on file. Social History   Occupational History  . Not  on file  Tobacco Use  . Smoking status: Current Some Day Smoker    Types: Cigars  . Smokeless tobacco: Never Used  Substance and Sexual Activity  . Alcohol use: Yes    Comment: occasionally  . Drug use: No  . Sexual activity: Yes

## 2018-10-30 ENCOUNTER — Telehealth: Payer: Self-pay | Admitting: Orthopaedic Surgery

## 2018-10-30 IMAGING — DX DG FOOT COMPLETE 3+V*L*
3 series · 3 of 3 positions shown · non-contrast
Comparison: None in PACs

CLINICAL DATA: Slipped and fell on slippery 4 today. The patient is
complaining of lateral foot pain over the fourth and fifth
metatarsal. Patient is limping.

EXAM:
LEFT FOOT - COMPLETE 3+ VIEW

[foot ap]
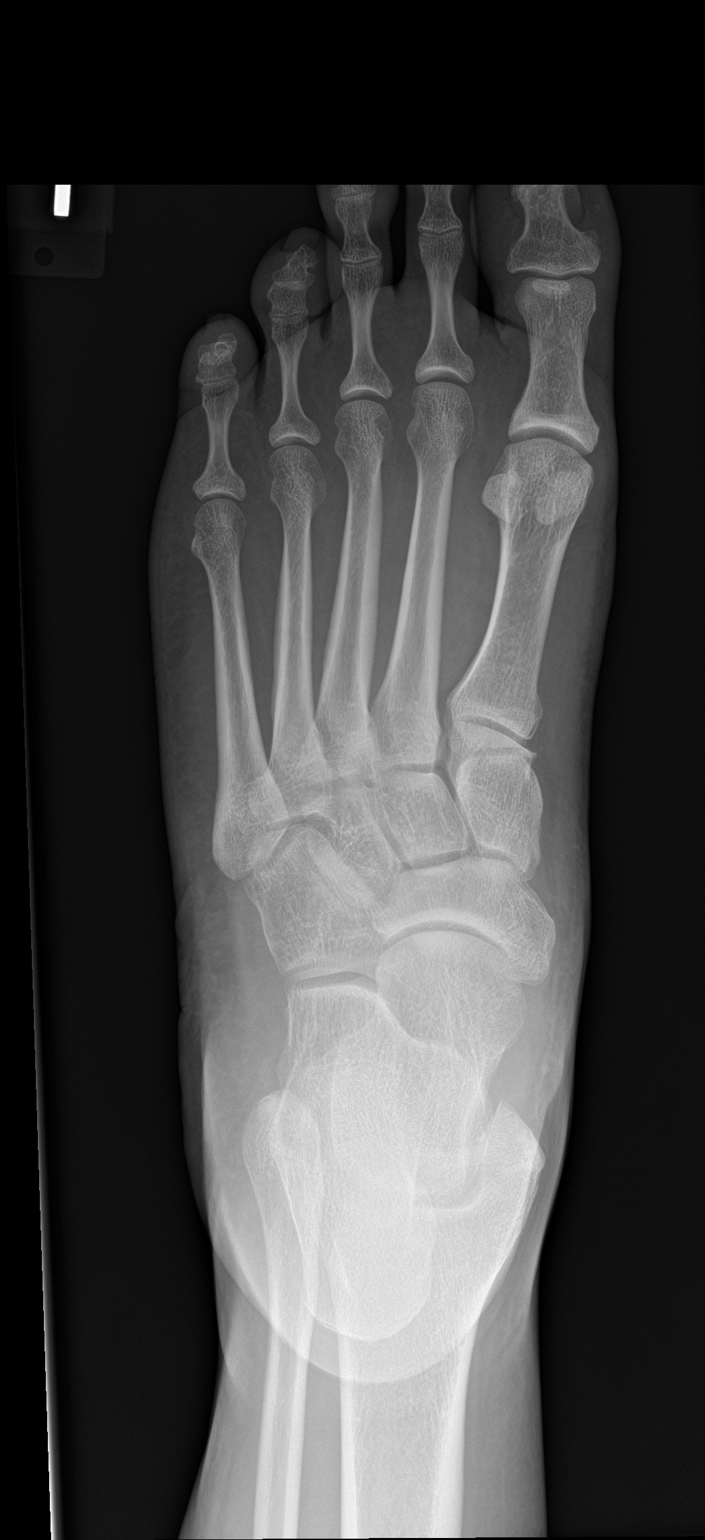

[foot obl]
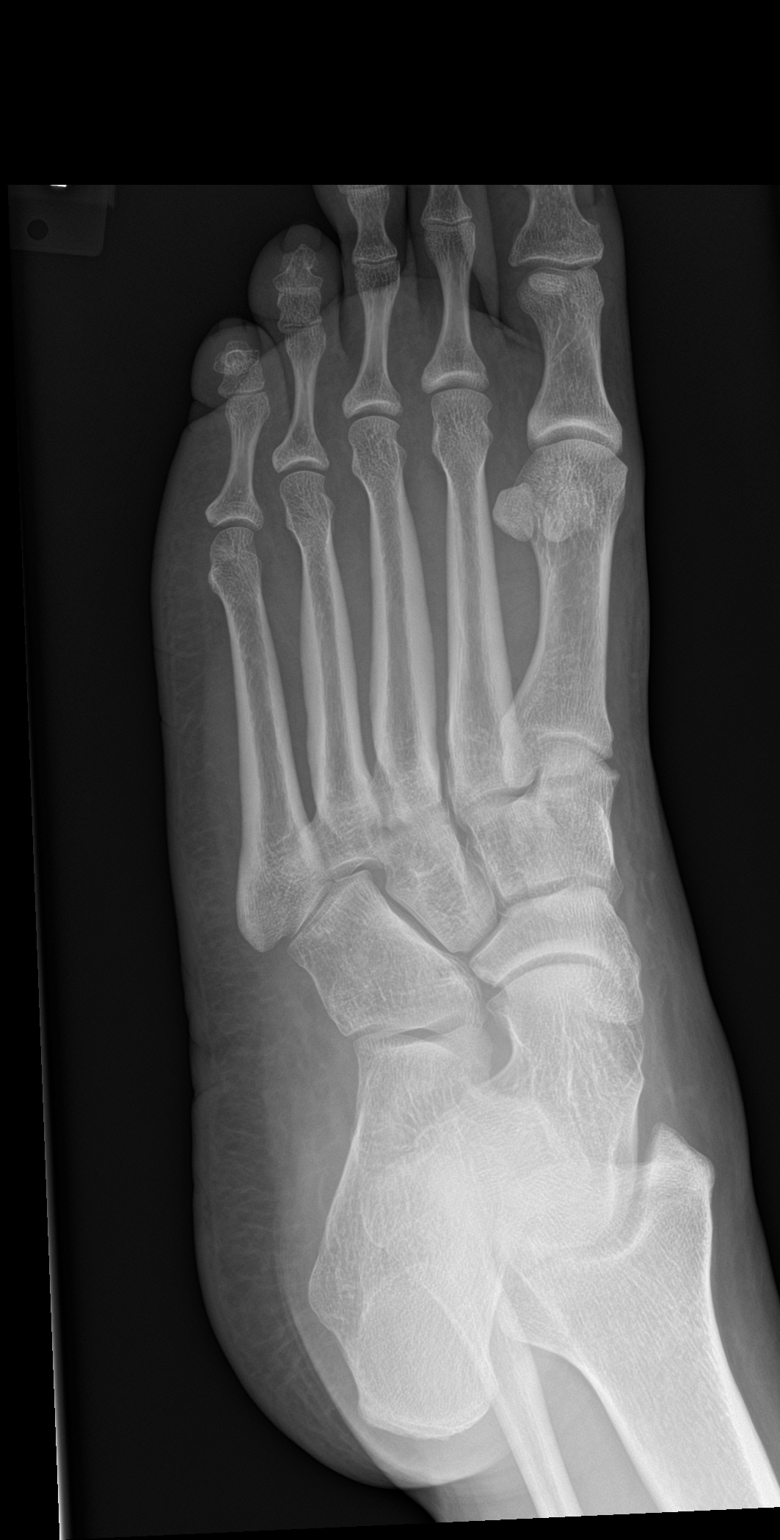

[foot lat]
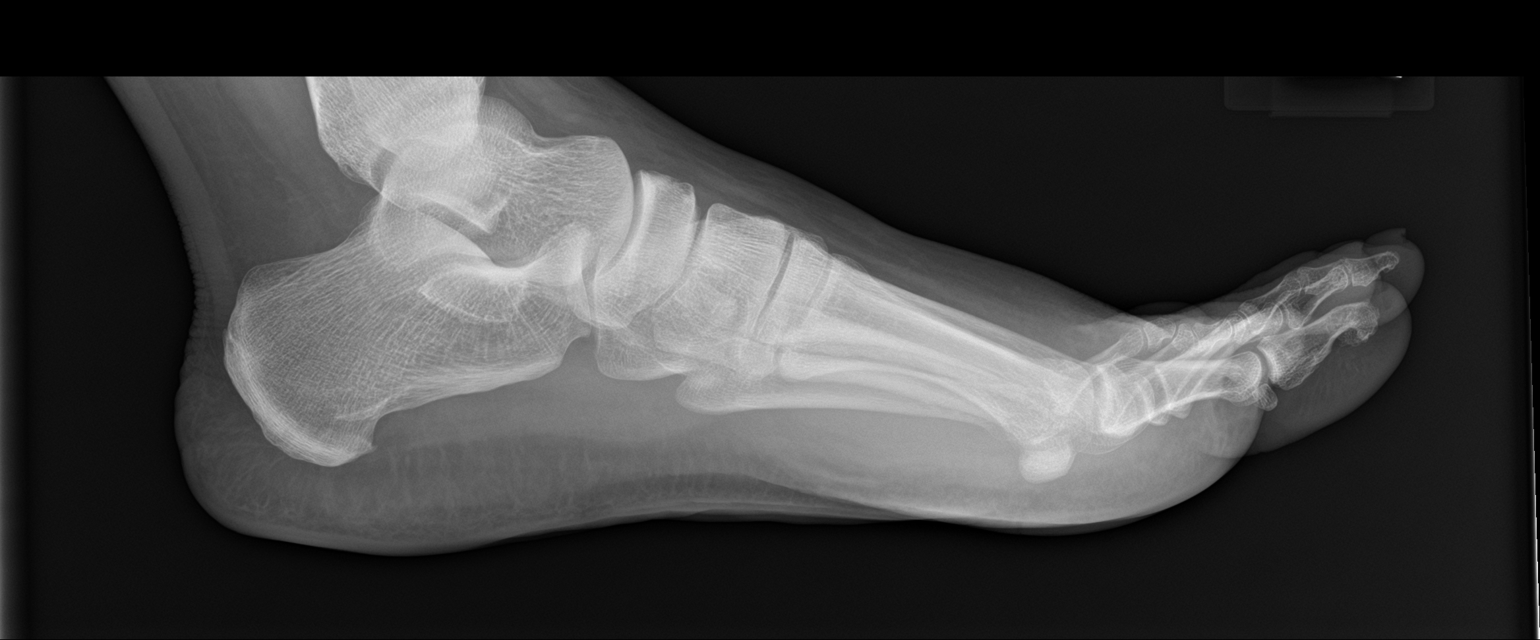

[3 of 3 positions shown; findings below may reference images not displayed]

FINDINGS: The bones are subjectively adequately mineralized. No acute fracture
or dislocation is observed. The joint spaces are well-maintained.
Specific attention to the fourth and fifth metatarsals reveals no
acute abnormality. The bones of the hindfoot are intact. There is a
tiny plantar calcaneal spur. There is mild soft tissue swelling over
the forefoot.
IMPRESSION: There is no acute fracture nor dislocation of the bones of the left
foot.

## 2018-10-30 NOTE — Telephone Encounter (Signed)
Called Patient got recording mailbox is full   Sent SMS notification to return call     MRI is scheduled for 11/22/2018    Need MRI review with Dr Ninfa Linden or Artis Delay

## 2018-11-21 ENCOUNTER — Telehealth: Payer: Self-pay | Admitting: Orthopaedic Surgery

## 2018-11-21 MED ORDER — DIAZEPAM 5 MG PO TABS: ORAL_TABLET | ORAL | 0 refills | Status: DC

## 2018-11-21 NOTE — Telephone Encounter (Signed)
Patient called left voicemail message that she is having a MRI 11/22/2018 and need some medication to calm her down. Patient said she is claustrophobic. Patient said she uses the [facility] at [facility] and [address]. The number to contact patient is [phone]

## 2018-11-21 NOTE — Telephone Encounter (Signed)
Can you as Dr Junius Roads about this please?

## 2018-11-21 NOTE — Telephone Encounter (Signed)
Please advise 

## 2018-11-21 NOTE — Telephone Encounter (Signed)
Rx sent 

## 2018-11-21 NOTE — Telephone Encounter (Signed)
I called the patient and advised her of the valium Rx that was sent in to her pharmacy, and instructions on how to take it. She voiced understanding.

## 2018-11-22 ENCOUNTER — Ambulatory Visit
Admission: RE | Admit: 2018-11-22 | Discharge: 2018-11-22 | Disposition: A | Discharge status: Home / Self Care | Payer: 59 | Source: Ambulatory Visit | Attending: Orthopaedic Surgery | Admitting: Orthopaedic Surgery

## 2018-11-22 ENCOUNTER — Other Ambulatory Visit: Payer: Self-pay

## 2018-11-22 DIAGNOSIS — M25562 Pain in left knee: Secondary | ICD-10-CM

## 2018-11-22 MED ORDER — GADOBENATE DIMEGLUMINE 529 MG/ML IV SOLN
17.0000 mL | Freq: Once | INTRAVENOUS | Status: AC | PRN
  Administered 2018-11-22: 17 mL via INTRAVENOUS

## 2018-11-25 ENCOUNTER — Ambulatory Visit (INDEPENDENT_AMBULATORY_CARE_PROVIDER_SITE_OTHER): Payer: 59 | Admitting: Orthopaedic Surgery

## 2018-11-25 ENCOUNTER — Encounter: Payer: Self-pay | Admitting: Orthopaedic Surgery

## 2018-11-25 DIAGNOSIS — G8929 Other chronic pain: Secondary | ICD-10-CM

## 2018-11-25 DIAGNOSIS — M25562 Pain in left knee: Secondary | ICD-10-CM | POA: Diagnosis not present

## 2018-11-25 NOTE — Progress Notes (Signed)
Subjective: She is here for aspiration and injection of left knee medial side ganglion cyst.  Objective: Swelling and tenderness on the medial side of the knee.  Procedure: Ultrasound-guided ganglion cyst aspiration and injection: After sterile prep with Betadine, injected 3 cc 1% lidocaine without epinephrine then aspirated just about 20 cc of serosanguineous fluid from the multiloculated cyst, then injected 40 mg of methylprednisolone.  She will follow-up with Dr. Ninfa Linden in 1 month.

## 2018-11-25 NOTE — Progress Notes (Signed)
HPI: Ms. Plude returns today to review of the MRI of her left knee.  She continues to have pain and swelling more medial aspect of the knee.  MRI dated 11/22/2018 is reviewed with the patient.  Actual images are reviewed.  Medial aspect of the knee large multi loculated ganglion cysts overlying the proximal MCL and medial femoral condyle seen.  Focal high-grade full-thickness cartilage loss involving the medial femoral condyle.  Small radial tear involving the medial body and posterior horne.  Early fissuring of the patellofemoral joint.  ROS: No fevers or chills . See HPI  Physical exam: Good range of motion of the left knee mass medial aspect of the knee.  Impression: Left knee large multiloculated ganglion cyst Left knee osteoarthritis  Plan: Dr. Junius Roads is available today for aspiration under ultrasound and intra-articular injection with cortisone of the left knee.  We will see her back in 4 weeks to see what type of response she has this.  Did discuss with her the knee ganglion cyst could recur.  She will continue to work on strengthening of the knee, knee friendly exercises which were discussed today and keeping her weight down.  Also suggested that she try taking some turmeric.  Questions encouraged and answered at length.

## 2018-12-05 ENCOUNTER — Other Ambulatory Visit: Payer: Self-pay

## 2018-12-05 ENCOUNTER — Ambulatory Visit (HOSPITAL_COMMUNITY)
Admission: EM | Admit: 2018-12-05 | Discharge: 2018-12-05 | Disposition: A | Discharge status: Home / Self Care | Payer: 59 | Attending: Internal Medicine | Admitting: Internal Medicine

## 2018-12-05 ENCOUNTER — Encounter (HOSPITAL_COMMUNITY): Payer: Self-pay

## 2018-12-05 DIAGNOSIS — Z202 Contact with and (suspected) exposure to infections with a predominantly sexual mode of transmission: Secondary | ICD-10-CM

## 2018-12-05 DIAGNOSIS — N898 Other specified noninflammatory disorders of vagina: Secondary | ICD-10-CM | POA: Diagnosis not present

## 2018-12-05 DIAGNOSIS — Z3202 Encounter for pregnancy test, result negative: Secondary | ICD-10-CM | POA: Diagnosis not present

## 2018-12-05 LAB — POCT URINALYSIS DIP (DEVICE)
Bilirubin Urine: NEGATIVE
Glucose, UA: NEGATIVE mg/dL
Ketones, ur: 15 mg/dL — AB
Leukocytes,Ua: NEGATIVE
Nitrite: NEGATIVE
Protein, ur: 30 mg/dL — AB
Specific Gravity, Urine: >=1.03 (ref 1.005–1.030)
Urobilinogen, UA: 0.2 mg/dL (ref 0.0–1.0)
pH: 6 (ref 5.0–8.0)

## 2018-12-05 LAB — POCT PREGNANCY, URINE: Preg Test, Ur: NEGATIVE

## 2018-12-05 MED ORDER — CEFTRIAXONE SODIUM 250 MG IJ SOLR
250.0000 mg | Freq: Once | INTRAMUSCULAR | Status: AC
  Administered 2018-12-05: 250 mg via INTRAMUSCULAR

## 2018-12-05 MED ORDER — AZITHROMYCIN 250 MG PO TABS
1000.0000 mg | ORAL_TABLET | Freq: Once | ORAL | Status: AC
  Administered 2018-12-05: 1000 mg via ORAL

## 2018-12-05 MED ORDER — AZITHROMYCIN 250 MG PO TABS
ORAL_TABLET | ORAL | Status: AC
  Filled 2018-12-05: qty 4

## 2018-12-05 MED ORDER — STERILE WATER FOR INJECTION IJ SOLN
INTRAMUSCULAR | Status: AC
  Filled 2018-12-05: qty 10

## 2018-12-05 MED ORDER — CEFTRIAXONE SODIUM 250 MG IJ SOLR
INTRAMUSCULAR | Status: AC
  Filled 2018-12-05: qty 250

## 2018-12-05 MED ORDER — METRONIDAZOLE 500 MG PO TABS: 500.0000 mg | ORAL_TABLET | Freq: Two times a day (BID) | ORAL | 0 refills | Status: DC

## 2018-12-05 NOTE — ED Provider Notes (Signed)
[facility]    CSN: [id number] Arrival date & time: 12/05/18  0800      History   Chief Complaint No chief complaint on file.   HPI Rebecca Moody is a 43 y.o. female.   Patient presents with request for STD testing and treatment; she would like HIV and RPR testing.  She states she was having sexual intercourse 2 to 3 weeks ago and a condom failed.  She reports yellow vaginal discharge x1 week. LMP: 2 weeks.  She denies fever, chills, abdominal pain, dysuria, back pain, pelvic pain, or other symptoms.    The history is provided by the patient.    Past Medical History:  Diagnosis Date  . AMA (advanced maternal age) multigravida 35+   . H/O varicella   . Hypertension     Patient Active Problem List   Diagnosis Date Noted  . Accelerated hypertension 12/23/2013  . Normal pregnancy 09/27/2011    History reviewed. No pertinent surgical history.  OB History    Gravida  3   Para  2   Term  2   Preterm  0   AB  1   Living  2     SAB  1   TAB  0   Ectopic  0   Multiple  0   Live Births  2            Home Medications    Prior to Admission medications   Medication Sig Start Date End Date Taking? Authorizing Provider  hydrochlorothiazide (MICROZIDE) 12.5 MG capsule TAKE 1 CAPSULE(12.5 MG) BY MOUTH DAILY 04/21/16  Yes Jeffery, Chelle, PA  amLODipine (NORVASC) 2.5 MG tablet TAKE 1 TABLET(2.5 MG) BY MOUTH DAILY Patient taking differently: 5 mg daily 04/21/16   Porfirio Oar, PA  diazepam (VALIUM) 5 MG tablet 1 PO 1 hour before MRI, repeat prn 11/21/18   Hilts, Michael, MD  metroNIDAZOLE (FLAGYL) 500 MG tablet Take 1 tablet (500 mg total) by mouth 2 (two) times daily. 12/05/18   Mickie Bail, NP  Semaglutide (OZEMPIC) 1 MG/DOSE SOPN Inject into the skin.    [provider]  dicyclomine (BENTYL) 20 MG tablet Take 1 tablet (20 mg total) by mouth 4 (four) times daily. 07/07/18 10/17/18  Janace Aris, NP    Family History Family History   Problem Relation Age of Onset  . Diabetes Father   . Stroke Father   . Healthy Mother     Social History Social History   Tobacco Use  . Smoking status: Current Some Day Smoker    Types: Cigars  . Smokeless tobacco: Never Used  Substance Use Topics  . Alcohol use: Yes    Comment: occasionally  . Drug use: No     Allergies   Patient has no known allergies.   Review of Systems Review of Systems  Constitutional: Negative for chills and fever.  HENT: Negative for ear pain and sore throat.   Eyes: Negative for pain and visual disturbance.  Respiratory: Negative for cough and shortness of breath.   Cardiovascular: Negative for chest pain and palpitations.  Gastrointestinal: Negative for abdominal pain and vomiting.  Genitourinary: Positive for vaginal discharge. Negative for dysuria, flank pain, hematuria and pelvic pain.  Musculoskeletal: Negative for arthralgias and back pain.  Skin: Negative for color change and rash.  Neurological: Negative for seizures and syncope.  All other systems reviewed and are negative.    Physical Exam Triage Vital Signs ED Triage Vitals  Enc Vitals Group     BP 12/05/18 0821 140/85     Pulse Rate 12/05/18 0819 72     Resp 12/05/18 0819 16     Temp 12/05/18 0819 98.3 F (36.8 C)     Temp Source 12/05/18 0819 Oral     SpO2 12/05/18 0819 98 %     Weight --      Height --      Head Circumference --      Peak Flow --      Pain Score 12/05/18 0820 0     Pain Loc --      Pain Edu? --      Excl. in GC? --    No data found.  Updated Vital Signs BP 140/85 (BP Location: Right Arm)   Pulse 72   Temp 98.3 F (36.8 C) (Oral)   Resp 16   LMP 11/23/2018   SpO2 98%   Visual Acuity Right Eye Distance:   Left Eye Distance:   Bilateral Distance:    Right Eye Near:   Left Eye Near:    Bilateral Near:     Physical Exam Vitals signs and nursing note reviewed.  Constitutional:      General: She is not in acute distress.     Appearance: She is well-developed.  HENT:     Head: Normocephalic and atraumatic.     Mouth/Throat:     Mouth: Mucous membranes are moist.     Pharynx: Oropharynx is clear.  Eyes:     Conjunctiva/sclera: Conjunctivae normal.  Neck:     Musculoskeletal: Neck supple.  Cardiovascular:     Rate and Rhythm: Normal rate and regular rhythm.     Heart sounds: No murmur.  Pulmonary:     Effort: Pulmonary effort is normal. No respiratory distress.     Breath sounds: Normal breath sounds.  Abdominal:     Palpations: Abdomen is soft.     Tenderness: There is no abdominal tenderness. There is no right CVA tenderness, left CVA tenderness, guarding or rebound.  Skin:    General: Skin is warm and dry.     Findings: No rash.  Neurological:     Mental Status: She is alert.      UC Treatments / Results  Labs (all labs ordered are listed, but only abnormal results are displayed) Labs Reviewed  POCT URINALYSIS DIP (DEVICE) - Abnormal; Notable for the following components:      Result Value   Ketones, ur 15 (*)    Hgb urine dipstick TRACE (*)    Protein, ur 30 (*)    All other components within normal limits  POCT PREGNANCY, URINE  CERVICOVAGINAL ANCILLARY ONLY    EKG   Radiology No results found.  Procedures Procedures (including critical care time)  Medications Ordered in UC Medications  azithromycin (ZITHROMAX) tablet 1,000 mg (has no administration in time range)  cefTRIAXone (ROCEPHIN) injection 250 mg (has no administration in time range)  azithromycin (ZITHROMAX) 250 MG tablet (has no administration in time range)  sterile water (preservative free) injection (has no administration in time range)  cefTRIAXone (ROCEPHIN) 250 MG injection (has no administration in time range)    Initial Impression / Assessment and Plan / UC Course  I have reviewed the triage vital signs and the nursing notes.  Pertinent labs & imaging results that were available during my care of the  patient were reviewed by me and considered in my medical decision making (see chart for  details).    Vaginal discharge, potential exposure to STD.  Treated today with Rocephin, Zithromax, metronidazole.  Discussed with patient that she should not have sex for 7 days.  Discussed that we will call her if her STD testing is positive and that at that time her partner may need treatment.  Instructed patient to return here if she develops fever, chills, abdominal pain, pelvic pain, back pain, dysuria, or other concerning symptoms.  Patient agrees with treatment plan.   Final Clinical Impressions(s) / UC Diagnoses   Final diagnoses:  Vaginal discharge  Potential exposure to STD     Discharge Instructions     You were treated with 2 antibiotics today, Rocephin and Zithromax.  You should also take the prescribed metronidazole twice a day for 7 days.  Your STD tests are pending.  Do not have sex for 7 days.  If your test results are positive, we will call you.  You may need additional treatment and your partner may also need treatment.           ED Prescriptions    Medication Sig Dispense Auth. Provider   metroNIDAZOLE (FLAGYL) 500 MG tablet Take 1 tablet (500 mg total) by mouth 2 (two) times daily. 14 tablet Sharion Balloon, NP     Controlled Substance Prescriptions [place] Controlled Substance Registry consulted? Not Applicable   Sharion Balloon, NP 12/05/18 [phone]

## 2018-12-05 NOTE — Discharge Instructions (Signed)
You were treated with 2 antibiotics today, Rocephin and Zithromax.  You should also take the prescribed metronidazole twice a day for 7 days.  Your STD tests are pending.  Do not have sex for 7 days.  If your test results are positive, we will call you.  You may need additional treatment and your partner may also need treatment.

## 2018-12-05 NOTE — ED Triage Notes (Signed)
Pt presents to UC with c/o sexual intercourse 3 weeks ago and states that the condom came off. Pt does not know if partner has STI. Pt reports having small amounts of yellowish discharge x1 week

## 2018-12-06 LAB — RPR: RPR Ser Ql: NONREACTIVE

## 2018-12-06 LAB — HIV ANTIBODY (ROUTINE TESTING W REFLEX): HIV Screen 4th Generation wRfx: NONREACTIVE

## 2018-12-10 LAB — CERVICOVAGINAL ANCILLARY ONLY
Bacterial vaginitis: NEGATIVE
Candida vaginitis: NEGATIVE
Chlamydia: NEGATIVE
Neisseria Gonorrhea: NEGATIVE
Trichomonas: NEGATIVE

## 2018-12-22 ENCOUNTER — Encounter: Payer: Self-pay | Admitting: Orthopaedic Surgery

## 2018-12-22 ENCOUNTER — Ambulatory Visit (INDEPENDENT_AMBULATORY_CARE_PROVIDER_SITE_OTHER): Payer: 59 | Admitting: Orthopaedic Surgery

## 2018-12-22 ENCOUNTER — Other Ambulatory Visit: Payer: Self-pay

## 2018-12-22 DIAGNOSIS — G8929 Other chronic pain: Secondary | ICD-10-CM

## 2018-12-22 DIAGNOSIS — M25562 Pain in left knee: Secondary | ICD-10-CM | POA: Diagnosis not present

## 2018-12-22 NOTE — Progress Notes (Signed)
The patient comes in today with continued left knee pain.  It has not been swollen like it was before but she feels like the fluid may be coming back.  She has meals on tenderness.  An MRI obtained earlier this year of her left knee did show moderate cartilage loss in the medial compartment with a very small meniscal tear.  Is the medial side of her knee that hurts her the most.  We did place a steroid injection in her knee just a month ago on the left side.  On exam there is no effusion today of her left knee.  There is medial joint line tenderness.  She has been working on activity modification with her left knee and limiting some of the things she does in the gym as she works out.  At this point we will see her back in about 2 months because we can always repeat a steroid injection then.  The other option would be an arthroscopic intervention because we will hold off on a knee replacement given her young age of 57.  All question concerns were answered addressed.  We will see her back in 2 months for repeat exam of her left knee.

## 2019-02-21 ENCOUNTER — Other Ambulatory Visit (HOSPITAL_COMMUNITY)
Admission: RE | Admit: 2019-02-21 | Discharge: 2019-02-21 | Disposition: A | Discharge status: Home / Self Care | Payer: 59 | Source: Ambulatory Visit | Attending: Family Medicine | Admitting: Family Medicine

## 2019-02-21 ENCOUNTER — Other Ambulatory Visit: Payer: Self-pay | Admitting: Family Medicine

## 2019-02-21 DIAGNOSIS — N898 Other specified noninflammatory disorders of vagina: Secondary | ICD-10-CM | POA: Insufficient documentation

## 2019-02-24 LAB — MOLECULAR ANCILLARY ONLY
Bacterial Vaginitis (gardnerella): NEGATIVE
Candida Glabrata: NEGATIVE
Candida Vaginitis: POSITIVE — AB
Chlamydia: NEGATIVE
Comment: NEGATIVE
Comment: NEGATIVE
Comment: NEGATIVE
Comment: NEGATIVE
Comment: NEGATIVE
Comment: NORMAL
Neisseria Gonorrhea: NEGATIVE
Trichomonas: NEGATIVE

## 2019-03-02 ENCOUNTER — Other Ambulatory Visit: Payer: Self-pay

## 2019-03-02 ENCOUNTER — Ambulatory Visit (INDEPENDENT_AMBULATORY_CARE_PROVIDER_SITE_OTHER): Payer: 59 | Admitting: Orthopaedic Surgery

## 2019-03-02 ENCOUNTER — Encounter: Payer: Self-pay | Admitting: Orthopaedic Surgery

## 2019-03-02 DIAGNOSIS — M25562 Pain in left knee: Secondary | ICD-10-CM | POA: Diagnosis not present

## 2019-03-02 DIAGNOSIS — G8929 Other chronic pain: Secondary | ICD-10-CM | POA: Diagnosis not present

## 2019-03-02 MED ORDER — LIDOCAINE HCL 1 % IJ SOLN
3.0000 mL | INTRAMUSCULAR | Status: AC | PRN
  Administered 2019-03-02: 3 mL

## 2019-03-02 MED ORDER — METHYLPREDNISOLONE ACETATE 40 MG/ML IJ SUSP
40.0000 mg | INTRAMUSCULAR | Status: AC | PRN
  Administered 2019-03-02: 40 mg via INTRA_ARTICULAR

## 2019-03-02 NOTE — Progress Notes (Signed)
Office Visit Note   Patient: Rebecca Moody           Date of Birth: Sep 25, 1975           MRN: [id number] Visit Date: 03/02/2019              Requested by: Renaye Rakers, MD [address] [place],  [place] [address] PCP: Renaye Rakers, MD   Assessment & Plan: Visit Diagnoses:  1. Chronic pain of left knee     Plan: I was able to clean her knee and then placed a steroid injection without difficulty.  I did try to aspirate fluid from the knee but did not get much fluid at all.  She tolerated steroid injection well.  She understands the risk and benefits of these having had them done before.  All question concerns were answered and addressed.  Follow-up will be as needed.  Follow-Up Instructions: Return if symptoms worsen or fail to improve.   Orders:  Orders Placed This Encounter  Procedures  . Large Joint Inj   No orders of the defined types were placed in this encounter.     Procedures: Large Joint Inj: L knee on 03/02/2019 8:42 AM Indications: diagnostic evaluation and pain Details: 22 G 1.5 in needle, superolateral approach  Arthrogram: No  Medications: 3 mL lidocaine 1 %; 40 mg methylPREDNISolone acetate 40 MG/ML Outcome: tolerated well, no immediate complications Procedure, treatment alternatives, risks and benefits explained, specific risks discussed. Consent was given by the patient. Immediately prior to procedure a time out was called to verify the correct patient, procedure, equipment, support staff and site/side marked as required. Patient was prepped and draped in the usual sterile fashion.       Clinical Data: No additional findings.   Subjective: Chief Complaint  Patient presents with  . Left Knee - Follow-up  The patient is well-known to me.  We saw her in August she had a large effusion in her left knee and we aspirated fluid from the knee.  We did send her for an MRI and it showed a small area of full-thickness cartilage loss on the medial femoral  condyle in the weightbearing surface with no meniscal tear.  She is only 43 years old and she is not obese.  She does feel like the knee is hurting again and she would like to have another injection today.  Is been over 3 months since her last injection.  She feels like there may be extra fluid on the knee as well.  She has had no other acute change in her medical status.  HPI  Review of Systems Today she denies any headache, chest pain, shortness of breath, fever, chills, nausea, vomiting  Objective: Vital Signs: There were no vitals taken for this visit.  Physical Exam She is alert and orient x3 and in no acute distress Ortho Exam Examination of her left knee shows pain on the medial joint line.  There is some patellofemoral crepitation as well but good range of motion.  Her knee is ligamentously stable.  I do not feel much of an effusion. Specialty Comments:  No specialty comments available.  Imaging: No results found.   PMFS History: Patient Active Problem List   Diagnosis Date Noted  . Accelerated hypertension 12/23/2013  . Normal pregnancy 09/27/2011   Past Medical History:  Diagnosis Date  . AMA (advanced maternal age) multigravida 35+   . H/O varicella   . Hypertension  Family History  Problem Relation Age of Onset  . Diabetes Father   . Stroke Father   . Healthy Mother     History reviewed. No pertinent surgical history. Social History   Occupational History  . Not on file  Tobacco Use  . Smoking status: Current Some Day Smoker    Types: Cigars  . Smokeless tobacco: Never Used  Substance and Sexual Activity  . Alcohol use: Yes    Comment: occasionally  . Drug use: No  . Sexual activity: Yes

## 2019-05-25 ENCOUNTER — Other Ambulatory Visit: Payer: Self-pay

## 2019-05-25 ENCOUNTER — Ambulatory Visit (HOSPITAL_COMMUNITY)
Admission: EM | Admit: 2019-05-25 | Discharge: 2019-05-25 | Disposition: A | Discharge status: Home / Self Care | Payer: 59 | Attending: Family Medicine | Admitting: Family Medicine

## 2019-05-25 ENCOUNTER — Encounter (HOSPITAL_COMMUNITY): Payer: Self-pay

## 2019-05-25 DIAGNOSIS — L739 Follicular disorder, unspecified: Secondary | ICD-10-CM | POA: Diagnosis not present

## 2019-05-25 DIAGNOSIS — I10 Essential (primary) hypertension: Secondary | ICD-10-CM | POA: Diagnosis not present

## 2019-05-25 DIAGNOSIS — Z79899 Other long term (current) drug therapy: Secondary | ICD-10-CM | POA: Diagnosis not present

## 2019-05-25 DIAGNOSIS — F17290 Nicotine dependence, other tobacco product, uncomplicated: Secondary | ICD-10-CM | POA: Diagnosis not present

## 2019-05-25 DIAGNOSIS — Z113 Encounter for screening for infections with a predominantly sexual mode of transmission: Secondary | ICD-10-CM | POA: Insufficient documentation

## 2019-05-25 MED ORDER — DOXYCYCLINE HYCLATE 100 MG PO CAPS: 100.0000 mg | ORAL_CAPSULE | Freq: Two times a day (BID) | ORAL | 0 refills | Status: DC

## 2019-05-25 NOTE — ED Triage Notes (Signed)
Pt presents with bump on right side of groin that leaves area tender to touch X 2 days.

## 2019-05-25 NOTE — ED Provider Notes (Signed)
[facility]    CSN: [id number] Arrival date & time: 05/25/19  0805      History   Chief Complaint Chief Complaint  Patient presents with  . Bump on Groin    HPI Rebecca Moody is a 44 y.o. female.   Reports that she has a "bump" near her vagina. Reports history of follicutlits. Has been there for 3 days, reports tenderness, denies drainage, denies vaginal discharge. Reports that she uses condoms during sex, but is requesting STD testing today.  The history is provided by the patient.    Past Medical History:  Diagnosis Date  . AMA (advanced maternal age) multigravida 35+   . H/O varicella   . Hypertension     Patient Active Problem List   Diagnosis Date Noted  . Accelerated hypertension 12/23/2013  . Normal pregnancy 09/27/2011    History reviewed. No pertinent surgical history.  OB History    Gravida  3   Para  2   Term  2   Preterm  0   AB  1   Living  2     SAB  1   TAB  0   Ectopic  0   Multiple  0   Live Births  2            Home Medications    Prior to Admission medications   Medication Sig Start Date End Date Taking? Authorizing Provider  amLODipine (NORVASC) 2.5 MG tablet TAKE 1 TABLET(2.5 MG) BY MOUTH DAILY Patient taking differently: 5 mg daily 04/21/16   Porfirio Oar, PA  diazepam (VALIUM) 5 MG tablet 1 PO 1 hour before MRI, repeat prn 11/21/18   Hilts, Casimiro Needle, MD  doxycycline (VIBRAMYCIN) 100 MG capsule Take 1 capsule (100 mg total) by mouth 2 (two) times daily. 05/25/19   Moshe Cipro, NP  hydrochlorothiazide (MICROZIDE) 12.5 MG capsule TAKE 1 CAPSULE(12.5 MG) BY MOUTH DAILY 04/21/16   Porfirio Oar, PA  metroNIDAZOLE (FLAGYL) 500 MG tablet Take 1 tablet (500 mg total) by mouth 2 (two) times daily. 12/05/18   Mickie Bail, NP  Semaglutide (OZEMPIC) 1 MG/DOSE SOPN Inject into the skin.    [provider]  dicyclomine (BENTYL) 20 MG tablet Take 1 tablet (20 mg total) by mouth 4 (four) times daily.  07/07/18 10/17/18  Janace Aris, NP    Family History Family History  Problem Relation Age of Onset  . Diabetes Father   . Stroke Father   . Healthy Mother     Social History Social History   Tobacco Use  . Smoking status: Current Some Day Smoker    Types: Cigars  . Smokeless tobacco: Never Used  Substance Use Topics  . Alcohol use: Yes    Comment: occasionally  . Drug use: No     Allergies   Patient has no known allergies.   Review of Systems Review of Systems  Constitutional: Negative for chills, fatigue and fever.  HENT: Negative for congestion, ear pain, rhinorrhea, sinus pressure, sinus pain, sneezing and sore throat.   Eyes: Negative for pain and visual disturbance.  Respiratory: Negative for cough, shortness of breath and wheezing.   Cardiovascular: Negative for chest pain and palpitations.  Gastrointestinal: Negative for abdominal pain, diarrhea, nausea and vomiting.  Endocrine: Negative for polyuria.  Genitourinary: Negative for dysuria, hematuria, menstrual problem, pelvic pain, urgency, vaginal bleeding, vaginal discharge and vaginal pain.  Musculoskeletal: Negative for arthralgias and back pain.  Skin: Negative for color change and  rash.  Neurological: Negative for dizziness, seizures, syncope, light-headedness, numbness and headaches.  All other systems reviewed and are negative.    Physical Exam Triage Vital Signs ED Triage Vitals  Enc Vitals Group     BP      Pulse      Resp      Temp      Temp src      SpO2      Weight      Height      Head Circumference      Peak Flow      Pain Score      Pain Loc      Pain Edu?      Excl. in GC?    No data found.  Updated Vital Signs BP 120/84 (BP Location: Left Arm)   Pulse 79   Temp 98.7 F (37.1 C) (Oral)   Resp 18   SpO2 98%   Physical Exam Vitals and nursing note reviewed.  Constitutional:      General: She is not in acute distress.    Appearance: She is well-developed.  HENT:      Head: Normocephalic and atraumatic.     Nose: Nose normal.     Mouth/Throat:     Mouth: Mucous membranes are moist.  Eyes:     Conjunctiva/sclera: Conjunctivae normal.  Cardiovascular:     Rate and Rhythm: Normal rate and regular rhythm.     Heart sounds: No murmur.  Pulmonary:     Effort: Pulmonary effort is normal. No respiratory distress.     Breath sounds: Normal breath sounds.  Abdominal:     General: Abdomen is flat. Bowel sounds are normal. There is no distension.     Palpations: Abdomen is soft. There is no mass.     Tenderness: There is no abdominal tenderness. There is no guarding.  Genitourinary:    Labia:        Left: Tenderness present.      Vagina: No vaginal discharge.       Comments: 1 cm diameter boil located at #1 on diagram, tender to touch Musculoskeletal:        General: No swelling or tenderness.     Cervical back: Neck supple.  Skin:    General: Skin is warm and dry.  Neurological:     Mental Status: She is alert.      UC Treatments / Results  Labs (all labs ordered are listed, but only abnormal results are displayed) Labs Reviewed  CERVICOVAGINAL ANCILLARY ONLY    EKG   Radiology No results found.  Procedures Procedures (including critical care time)  Medications Ordered in UC Medications - No data to display  Initial Impression / Assessment and Plan / UC Course  I have reviewed the triage vital signs and the nursing notes.  Pertinent labs & imaging results that were available during my care of the patient were reviewed by me and considered in my medical decision making (see chart for details).     Likely folliculitis, rx doxycycline BID x 7 days. Also requesting STD testing today. Pt self swabbed with Aptima swab. Will call with results. Instructed on when to seek further eval for folliculitis with GYN, and instructed on when to go to the Emergency Room. Final Clinical Impressions(s) / UC Diagnoses   Final diagnoses:    Folliculitis  Routine screening for STI (sexually transmitted infection)     Discharge Instructions       You  may use warm compresses 3-4x daily for 10-15 minutes.  You may then wash site daily with warm water and mild soap Keep covered to avoid friction  Take antibiotic as prescribed and to completion  Return sooner or go to the ED if you have any new or worsening symptoms such as increased redness, swelling, pain, nausea, vomiting, fever, chills, etc...     ED Prescriptions    Medication Sig Dispense Auth. Provider   doxycycline (VIBRAMYCIN) 100 MG capsule Take 1 capsule (100 mg total) by mouth 2 (two) times daily. 14 capsule Faustino Congress, NP     I have reviewed the PDMP during this encounter.   Faustino Congress, NP 05/25/19 [phone]

## 2019-05-25 NOTE — Discharge Instructions (Addendum)
   You may use warm compresses 3-4x daily for 10-15 minutes.  You may then wash site daily with warm water and mild soap Keep covered to avoid friction  Take antibiotic as prescribed and to completion  Return sooner or go to the ED if you have any new or worsening symptoms such as increased redness, swelling, pain, nausea, vomiting, fever, chills, etc..Marland Kitchen

## 2019-05-28 LAB — CERVICOVAGINAL ANCILLARY ONLY
Bacterial vaginitis: NEGATIVE
Candida vaginitis: POSITIVE — AB
Chlamydia: NEGATIVE
Neisseria Gonorrhea: NEGATIVE
Trichomonas: NEGATIVE

## 2019-06-01 ENCOUNTER — Telehealth (HOSPITAL_COMMUNITY): Payer: Self-pay | Admitting: Emergency Medicine

## 2019-06-01 ENCOUNTER — Encounter (HOSPITAL_COMMUNITY): Payer: Self-pay

## 2019-06-01 MED ORDER — FLUCONAZOLE 150 MG PO TABS: 150.0000 mg | ORAL_TABLET | Freq: Once | ORAL | 0 refills | Status: AC

## 2019-06-01 NOTE — Telephone Encounter (Signed)
Test for candida (yeast) was positive.  Prescription for fluconazole 150mg po now, repeat dose in 3d if needed, #2 no refills, sent to the pharmacy of record.  Recheck or followup with PCP for further evaluation if symptoms are not improving.    Attempted to reach patient. No answer at this time. Voicemail full  If you have any questions, you may call me at [phone]     

## 2019-06-16 ENCOUNTER — Telehealth: Payer: Self-pay | Admitting: Orthopaedic Surgery

## 2019-06-16 NOTE — Telephone Encounter (Signed)
Called patient left message on voicemail to return call to schedule an appointment with Dr Magnus Ivan for soreness in knees per mychart message

## 2019-06-18 ENCOUNTER — Encounter: Payer: Self-pay | Admitting: Physician Assistant

## 2019-06-18 ENCOUNTER — Other Ambulatory Visit: Payer: Self-pay

## 2019-06-18 ENCOUNTER — Telehealth: Payer: Self-pay | Admitting: Radiology

## 2019-06-18 ENCOUNTER — Ambulatory Visit (INDEPENDENT_AMBULATORY_CARE_PROVIDER_SITE_OTHER): Payer: 59 | Admitting: Physician Assistant

## 2019-06-18 DIAGNOSIS — M1712 Unilateral primary osteoarthritis, left knee: Secondary | ICD-10-CM | POA: Diagnosis not present

## 2019-06-18 MED ORDER — LIDOCAINE HCL 1 % IJ SOLN
5.0000 mL | INTRAMUSCULAR | Status: AC | PRN
  Administered 2019-06-18: 5 mL

## 2019-06-18 MED ORDER — METHYLPREDNISOLONE ACETATE 40 MG/ML IJ SUSP
40.0000 mg | INTRAMUSCULAR | Status: AC | PRN
  Administered 2019-06-18: 40 mg via INTRA_ARTICULAR

## 2019-06-18 NOTE — Progress Notes (Signed)
   Procedure Note  Patient: Rebecca Moody             Date of Birth: January 12, 1976           MRN: [id number]             Visit Date: 06/18/2019 HPI: Ms. Bains returns today follow-up left knee pain.  She states that she has had increased swelling pain in the knee over the last 2 weeks.  Pain is mostly medial.  Pain is worse after workouts.  She has been working with a Systems analyst doing squats, lunges and stairs.  States now that she is cut back on her exercising that her pain has diminished.  However she still has swelling in the knee.  No acute injury. Given the MRI showed full-thickness cartilage loss along medial femoral condyle and fraying inferior aspect patella. Review of systems: Negative for fevers chills shortness of breath chest pain.  Physical exam: Left knee full range of motion.  No instability valgus varus stressing.  Positive effusion.  Tenderness along medial joint line. Procedures: Visit Diagnoses:  1. Primary osteoarthritis of left knee     Large Joint Inj: L knee on 06/18/2019 1:57 PM Details: 18 G needle, superolateral approach Medications: 5 mL lidocaine 1 %; 40 mg methylPREDNISolone acetate 40 MG/ML Aspirate: 13 mL yellow and blood-tinged Consent was given by the patient. Immediately prior to procedure a time out was called to verify the correct patient, procedure, equipment, support staff and site/side marked as required. Patient was prepped and draped in the usual sterile fashion.     Plan: Recommend she stay away from deep squats lunges for exercise.  Also discussed knee friendly exercises with her at length.  We will try to obtain approval for supplemental injection.  Have her back once this is available.  Questions encouraged and answered.

## 2019-06-18 NOTE — Telephone Encounter (Signed)
Left Knee OA - Supplemental injection

## 2019-06-19 NOTE — Telephone Encounter (Signed)
Submitted for VOB for Synvisc One- Left Knee

## 2019-06-21 ENCOUNTER — Other Ambulatory Visit: Payer: Self-pay

## 2019-06-21 ENCOUNTER — Encounter (HOSPITAL_COMMUNITY): Payer: Self-pay

## 2019-06-21 ENCOUNTER — Ambulatory Visit (HOSPITAL_COMMUNITY)
Admission: EM | Admit: 2019-06-21 | Discharge: 2019-06-21 | Disposition: A | Discharge status: Home / Self Care | Payer: 59 | Attending: Family Medicine | Admitting: Family Medicine

## 2019-06-21 ENCOUNTER — Ambulatory Visit (INDEPENDENT_AMBULATORY_CARE_PROVIDER_SITE_OTHER): Payer: 59

## 2019-06-21 DIAGNOSIS — Z79899 Other long term (current) drug therapy: Secondary | ICD-10-CM | POA: Insufficient documentation

## 2019-06-21 DIAGNOSIS — Z7901 Long term (current) use of anticoagulants: Secondary | ICD-10-CM | POA: Insufficient documentation

## 2019-06-21 DIAGNOSIS — R519 Headache, unspecified: Secondary | ICD-10-CM | POA: Insufficient documentation

## 2019-06-21 DIAGNOSIS — Z20822 Contact with and (suspected) exposure to covid-19: Secondary | ICD-10-CM | POA: Insufficient documentation

## 2019-06-21 DIAGNOSIS — I10 Essential (primary) hypertension: Secondary | ICD-10-CM | POA: Diagnosis not present

## 2019-06-21 DIAGNOSIS — R062 Wheezing: Secondary | ICD-10-CM

## 2019-06-21 DIAGNOSIS — J40 Bronchitis, not specified as acute or chronic: Secondary | ICD-10-CM

## 2019-06-21 DIAGNOSIS — R05 Cough: Secondary | ICD-10-CM

## 2019-06-21 DIAGNOSIS — Z87891 Personal history of nicotine dependence: Secondary | ICD-10-CM | POA: Diagnosis not present

## 2019-06-21 LAB — SARS CORONAVIRUS 2 (TAT 6-24 HRS): SARS Coronavirus 2: NEGATIVE

## 2019-06-21 MED ORDER — BENZONATATE 100 MG PO CAPS: 100.0000 mg | ORAL_CAPSULE | Freq: Three times a day (TID) | ORAL | 0 refills | Status: DC

## 2019-06-21 MED ORDER — AZITHROMYCIN 250 MG PO TABS: 250.0000 mg | ORAL_TABLET | Freq: Every day | ORAL | 0 refills | Status: DC

## 2019-06-21 MED ORDER — ALBUTEROL SULFATE HFA 108 (90 BASE) MCG/ACT IN AERS
2.0000 | INHALATION_SPRAY | Freq: Once | RESPIRATORY_TRACT | Status: AC
  Administered 2019-06-21: 2 via RESPIRATORY_TRACT

## 2019-06-21 MED ORDER — PREDNISONE 10 MG PO TABS: 40.0000 mg | ORAL_TABLET | Freq: Every day | ORAL | 0 refills | Status: AC

## 2019-06-21 MED ORDER — GUAIFENESIN-DM 100-10 MG/5ML PO SYRP: 5.0000 mL | ORAL_SOLUTION | ORAL | 0 refills | Status: DC | PRN

## 2019-06-21 MED ORDER — ALBUTEROL SULFATE HFA 108 (90 BASE) MCG/ACT IN AERS
INHALATION_SPRAY | RESPIRATORY_TRACT | Status: AC
  Filled 2019-06-21: qty 6.7

## 2019-06-21 NOTE — ED Triage Notes (Signed)
Pt state she has a cough and wheezing x 2 days.  Pt states she has nasal drainage as well.

## 2019-06-21 NOTE — ED Provider Notes (Signed)
[place] [facility]    CSN: [id number] Arrival date & time: 06/21/19  1010      History   Chief Complaint Chief Complaint  Patient presents with  . Cough    HPI Genevive SANSA ALKEMA is a 44 y.o. female.   Patient presents today for cough and sinus congestion.  She reports symptoms started 2 days ago on Friday.  She reports yellow nasal discharge that has transitioned to clear nasal discharge since that time.  She initially had headache which was above and behind her eyes.  She reports that that has let off some.  Initially had a productive cough with yellow sputum but has also transitioned to clear.  She reports the cough has been worsening since that time.  She reports wheezing and mild shortness of breath with a cough and with exertion.  She is not short of breath at rest.  She reports chest burning with the cough.  She denies fever but possible chills yesterday.  She initially had a sore throat but that has mostly resolved at this point.  She reports having Covid in late December and had resolution of her symptoms at that time.  She would like to be tested today.  She reports her daughter was sick and out of school Wednesday preceding her symptoms starting with the upper respiratory-like symptoms.  Her daughter symptoms have completely resolved.  Reports her children are in person school.  Denies nausea vomiting diarrhea.   He does not have a history of asthma or COPD.  She is a previous smoker.     Past Medical History:  Diagnosis Date  . AMA (advanced maternal age) multigravida 26+   . H/O varicella   . Hypertension     Patient Active Problem List   Diagnosis Date Noted  . Accelerated hypertension 12/23/2013  . Normal pregnancy 09/27/2011    History reviewed. No pertinent surgical history.  OB History    Gravida  3   Para  2   Term  2   Preterm  0   AB  1   Living  2     SAB  1   TAB  0   Ectopic  0   Multiple  0   Live Births  2             Home Medications    Prior to Admission medications   Medication Sig Start Date End Date Taking? Authorizing Provider  amLODipine (NORVASC) 2.5 MG tablet TAKE 1 TABLET(2.5 MG) BY MOUTH DAILY Patient taking differently: 5 mg daily 04/21/16   Harrison Mons, PA  azithromycin (ZITHROMAX) 250 MG tablet Take 1 tablet (250 mg total) by mouth daily. Take first 2 tablets together, then 1 every day until finished. 06/21/19   Aalaya Yadao, Marguerita Beards, PA-C  benzonatate (TESSALON) 100 MG capsule Take 1 capsule (100 mg total) by mouth every 8 (eight) hours. 06/21/19   Ellanie Oppedisano, Marguerita Beards, PA-C  diazepam (VALIUM) 5 MG tablet 1 PO 1 hour before MRI, repeat prn 11/21/18   Hilts, Legrand Como, MD  doxycycline (VIBRAMYCIN) 100 MG capsule Take 1 capsule (100 mg total) by mouth 2 (two) times daily. 05/25/19   Faustino Congress, NP  guaiFENesin-dextromethorphan (ROBITUSSIN DM) 100-10 MG/5ML syrup Take 5 mLs by mouth every 4 (four) hours as needed for cough. 06/21/19   Shaine Mount, Marguerita Beards, PA-C  hydrochlorothiazide (MICROZIDE) 12.5 MG capsule TAKE 1 CAPSULE(12.5 MG) BY MOUTH DAILY 04/21/16   Harrison Mons, PA  metroNIDAZOLE (FLAGYL) 500 MG tablet  Take 1 tablet (500 mg total) by mouth 2 (two) times daily. 12/05/18   Mickie Bail, NP  predniSONE (DELTASONE) 10 MG tablet Take 4 tablets (40 mg total) by mouth daily for 5 days. 06/21/19 06/26/19  Norah Fick, Veryl Speak, PA-C  Semaglutide (OZEMPIC) 1 MG/DOSE SOPN Inject into the skin.    [provider]  dicyclomine (BENTYL) 20 MG tablet Take 1 tablet (20 mg total) by mouth 4 (four) times daily. 07/07/18 10/17/18  Janace Aris, NP    Family History Family History  Problem Relation Age of Onset  . Diabetes Father   . Stroke Father   . Healthy Mother     Social History Social History   Tobacco Use  . Smoking status: Current Some Day Smoker    Types: Cigars  . Smokeless tobacco: Never Used  Substance Use Topics  . Alcohol use: Not Currently    Comment: occasionally  . Drug use: No      Allergies   Patient has no known allergies.   Review of Systems Review of Systems  Constitutional: Negative for chills and fever.  HENT: Positive for congestion, postnasal drip, rhinorrhea and sore throat. Negative for ear pain, sinus pressure and sinus pain.   Eyes: Negative for pain and visual disturbance.  Respiratory: Positive for cough, chest tightness, shortness of breath and wheezing.   Cardiovascular: Negative for chest pain and palpitations.  Gastrointestinal: Negative for abdominal pain, diarrhea, nausea and vomiting.  Musculoskeletal: Negative for arthralgias, back pain and myalgias.  Skin: Negative for color change and rash.  Neurological: Negative for seizures, syncope and headaches.  All other systems reviewed and are negative.    Physical Exam Triage Vital Signs ED Triage Vitals  Enc Vitals Group     BP 06/21/19 1039 (!) 140/93     Pulse --      Resp 06/21/19 1039 18     Temp 06/21/19 1039 99.2 F (37.3 C)     Temp Source 06/21/19 1039 Oral     SpO2 06/21/19 1039 98 %     Weight 06/21/19 1037 210 lb (95.3 kg)     Height --      Head Circumference --      Peak Flow --      Pain Score 06/21/19 1037 6     Pain Loc --      Pain Edu? --      Excl. in GC? --    No data found.  Updated Vital Signs BP (!) 140/93 (BP Location: Right Arm)   Temp 99.2 F (37.3 C) (Oral)   Resp 18   Wt 210 lb (95.3 kg)   SpO2 98%   BMI 33.89 kg/m   Visual Acuity Right Eye Distance:   Left Eye Distance:   Bilateral Distance:    Right Eye Near:   Left Eye Near:    Bilateral Near:     Physical Exam Vitals and nursing note reviewed.  Constitutional:      General: She is not in acute distress.    Appearance: She is well-developed.  HENT:     Head: Normocephalic and atraumatic.  Eyes:     Conjunctiva/sclera: Conjunctivae normal.  Cardiovascular:     Rate and Rhythm: Normal rate and regular rhythm.     Heart sounds: No murmur. No friction rub.  Pulmonary:      Effort: Pulmonary effort is normal. No respiratory distress.     Breath sounds: No stridor. Wheezing (Mild expiratory wheezes throughout  fields.) and rhonchi (Appreciated and left lower and right middle fields) present. No rales.  Abdominal:     Palpations: Abdomen is soft.     Tenderness: There is no abdominal tenderness.  Musculoskeletal:     Cervical back: Neck supple.     Right lower leg: No edema.     Left lower leg: No edema.  Skin:    General: Skin is warm and dry.  Neurological:     General: No focal deficit present.     Mental Status: She is alert and oriented to person, place, and time.      UC Treatments / Results  Labs (all labs ordered are listed, but only abnormal results are displayed) Labs Reviewed  SARS CORONAVIRUS 2 (TAT 6-24 HRS)    EKG   Radiology DG Chest 2 View  Result Date: 06/21/2019 CLINICAL DATA:  Productive cough, wheezing EXAM: CHEST - 2 VIEW COMPARISON:  01/03/2016 chest radiograph. FINDINGS: Stable cardiomediastinal silhouette with normal heart size. No pneumothorax. No pleural effusion. Lungs appear clear, with no acute consolidative airspace disease and no pulmonary edema. IMPRESSION: No active cardiopulmonary disease. Electronically Signed   By: Delbert Phenix M.D.   On: 06/21/2019 11:35    Procedures Procedures (including critical care time)  Medications Ordered in UC Medications  albuterol (VENTOLIN HFA) 108 (90 Base) MCG/ACT inhaler 2 puff (2 puffs Inhalation Given 06/21/19 1154)    Initial Impression / Assessment and Plan / UC Course  I have reviewed the triage vital signs and the nursing notes.  Pertinent labs & imaging results that were available during my care of the patient were reviewed by me and considered in my medical decision making (see chart for details).     #Bronchitis Patient is a 44 year old former smoker with recent Covid infection in December 2020 presenting with upper respiratory symptoms with associated cough,  wheezing and shortness of breath.  Symptoms are consistent with bronchitis at this time.  Patient's chest x-ray with mild hyperinflation but no definitive COPD.  Smoking history is not completely clear as she has reported occasional cigar smoking but also being a previous smoker.  Patient is afebrile and saturating well.  Will start on albuterol and prednisone for wheezing, sent for azithromycin precaution for worsening productive cough at this time.  Tessalon and Robitussin for cough.  Instructed patient to follow-up with her primary care if she continues to have wheezing. -Covid PCR was sent as she is exiting the 90-day.  With symptoms consistent with Covid infection.  Though unlikely at this point.  She does report she had a negative Covid test following her initial positive.  These tests are not in our records.  Final Clinical Impressions(s) / UC Diagnoses   Final diagnoses:  Bronchitis     Discharge Instructions     There was not a pneumonia on your chest x-ray.  However I want you to utilize the treatment below  I want you to utilize the inhaler every 4 hours for the next 12-24 hours, then as needed  Take the prednisone and antibiotic as prescribed.   Take the Tessalon Perles during the day and the Robitussin at night.  If you continue to have wheezing and episodes similar to this would like you to follow-up with your primary care for further testing evaluation.  If your Covid-19 test is positive, you will receive a phone call from [facility] regarding your results. Negative test results are not called. Both positive and negative results area always visible on  MyChart. If you do not have a MyChart account, sign up instructions are in your discharge papers.   Persons who are directed to care for themselves at home may discontinue isolation under the following conditions:  . At least 10 days have passed since symptom onset and . At least 24 hours have passed without running a fever  (this means without the use of fever-reducing medications) and . Other symptoms have improved.  Persons infected with COVID-19 who never develop symptoms may discontinue isolation and other precautions 10 days after the date of their first positive COVID-19 test.        ED Prescriptions    Medication Sig Dispense Auth. Provider   predniSONE (DELTASONE) 10 MG tablet Take 4 tablets (40 mg total) by mouth daily for 5 days. 20 tablet Elois Averitt, Veryl Speak, PA-C   azithromycin (ZITHROMAX) 250 MG tablet Take 1 tablet (250 mg total) by mouth daily. Take first 2 tablets together, then 1 every day until finished. 6 tablet Muhamad Serano, Veryl Speak, PA-C   benzonatate (TESSALON) 100 MG capsule Take 1 capsule (100 mg total) by mouth every 8 (eight) hours. 21 capsule Sharmane Dame, Veryl Speak, PA-C   guaiFENesin-dextromethorphan (ROBITUSSIN DM) 100-10 MG/5ML syrup Take 5 mLs by mouth every 4 (four) hours as needed for cough. 118 mL Asiana Benninger, Veryl Speak, PA-C     PDMP not reviewed this encounter.   Hermelinda Medicus, PA-C 06/21/19 1227

## 2019-06-21 NOTE — Discharge Instructions (Addendum)
There was not a pneumonia on your chest x-ray.  However I want you to utilize the treatment below  I want you to utilize the inhaler every 4 hours for the next 12-24 hours, then as needed  Take the prednisone and antibiotic as prescribed.   Take the Tessalon Perles during the day and the Robitussin at night.  If you continue to have wheezing and episodes similar to this would like you to follow-up with your primary care for further testing evaluation.  If your Covid-19 test is positive, you will receive a phone call from [facility] regarding your results. Negative test results are not called. Both positive and negative results area always visible on MyChart. If you do not have a MyChart account, sign up instructions are in your discharge papers.   Persons who are directed to care for themselves at home may discontinue isolation under the following conditions:   At least 10 days have passed since symptom onset and  At least 24 hours have passed without running a fever (this means without the use of fever-reducing medications) and  Other symptoms have improved.  Persons infected with COVID-19 who never develop symptoms may discontinue isolation and other precautions 10 days after the date of their first positive COVID-19 test.

## 2019-06-23 ENCOUNTER — Telehealth: Payer: Self-pay

## 2019-06-23 NOTE — Telephone Encounter (Signed)
Approved for Synvisc One-Left knee Dr. Margarito Liner and [facility] No copay 15% OOP after deductible met Prior auth required with Albina Billet Auth # [id number] Dates: 06/22/19-06/20/20

## 2019-06-23 NOTE — Telephone Encounter (Signed)
New start

## 2019-06-23 NOTE — Telephone Encounter (Signed)
Pt called and informed and stated understanding of approval Appt scheduled for tomorrow am

## 2019-06-24 ENCOUNTER — Ambulatory Visit: Payer: 59 | Admitting: Orthopaedic Surgery

## 2019-06-30 ENCOUNTER — Other Ambulatory Visit: Payer: Self-pay

## 2019-06-30 ENCOUNTER — Encounter: Payer: Self-pay | Admitting: Orthopaedic Surgery

## 2019-06-30 ENCOUNTER — Ambulatory Visit (INDEPENDENT_AMBULATORY_CARE_PROVIDER_SITE_OTHER): Payer: 59 | Admitting: Orthopaedic Surgery

## 2019-06-30 DIAGNOSIS — M1712 Unilateral primary osteoarthritis, left knee: Secondary | ICD-10-CM

## 2019-06-30 MED ORDER — LIDOCAINE HCL 1 % IJ SOLN
1.0000 mL | INTRAMUSCULAR | Status: AC | PRN
  Administered 2019-06-30: 1 mL

## 2019-06-30 MED ORDER — HYLAN G-F 20 48 MG/6ML IX SOSY
48.0000 mg | PREFILLED_SYRINGE | INTRA_ARTICULAR | Status: AC | PRN
  Administered 2019-06-30: 48 mg via INTRA_ARTICULAR

## 2019-06-30 NOTE — Progress Notes (Signed)
   Procedure Note  Patient: Rebecca Moody             Date of Birth: 1976-02-23           MRN: [id number]             Visit Date: 06/30/2019 HPI: Rebecca Moody returns today for Synvisc 1 injection left knee.  States the pain overall is improved.  She still has some pain when standing up.  She is working on Dance movement psychotherapist.  She had no new injury.  Does not feel she has had any swelling in the knee since the aspiration.  Physical exam: Left knee good range of motion.  No abnormal warmth erythema or effusion.  Procedures: Visit Diagnoses:  1. Primary osteoarthritis of left knee     Large Joint Inj on 06/30/2019 9:10 AM Indications: pain Details: 22 G 1.5 in needle, superolateral approach  Arthrogram: No  Medications: 48 mg Hylan 48 MG/6ML; 1 mL lidocaine 1 % Outcome: tolerated well, no immediate complications Procedure, treatment alternatives, risks and benefits explained, specific risks discussed. Consent was given by the patient. Immediately prior to procedure a time out was called to verify the correct patient, procedure, equipment, support staff and site/side marked as required. Patient was prepped and draped in the usual sterile fashion.     Plan she will continue work on quad strengthening.  She understands she can should wait at least 6 months between Synvisc 1 injections.  Follow-up with [place] on an as-needed basis.

## 2019-07-02 ENCOUNTER — Other Ambulatory Visit (HOSPITAL_COMMUNITY): Payer: Self-pay | Admitting: Family Medicine

## 2019-07-17 ENCOUNTER — Other Ambulatory Visit (HOSPITAL_COMMUNITY)
Admission: RE | Admit: 2019-07-17 | Discharge: 2019-07-17 | Disposition: A | Discharge status: Home / Self Care | Payer: 59 | Source: Ambulatory Visit | Attending: Family Medicine | Admitting: Family Medicine

## 2019-07-17 ENCOUNTER — Other Ambulatory Visit: Payer: Self-pay | Admitting: Family Medicine

## 2019-07-17 DIAGNOSIS — B373 Candidiasis of vulva and vagina: Secondary | ICD-10-CM | POA: Diagnosis not present

## 2019-07-17 DIAGNOSIS — N898 Other specified noninflammatory disorders of vagina: Secondary | ICD-10-CM | POA: Diagnosis present

## 2019-07-21 LAB — MOLECULAR ANCILLARY ONLY
Bacterial Vaginitis (gardnerella): NEGATIVE
Candida Glabrata: NEGATIVE
Candida Vaginitis: POSITIVE — AB
Chlamydia: NEGATIVE
Comment: NEGATIVE
Comment: NEGATIVE
Comment: NEGATIVE
Comment: NEGATIVE
Comment: NEGATIVE
Comment: NORMAL
Neisseria Gonorrhea: NEGATIVE
Trichomonas: NEGATIVE

## 2019-08-27 ENCOUNTER — Other Ambulatory Visit (HOSPITAL_COMMUNITY)
Admission: RE | Admit: 2019-08-27 | Discharge: 2019-08-27 | Disposition: A | Discharge status: Home / Self Care | Payer: 59 | Source: Ambulatory Visit | Attending: Family Medicine | Admitting: Family Medicine

## 2019-08-27 ENCOUNTER — Other Ambulatory Visit: Payer: Self-pay | Admitting: Family Medicine

## 2019-08-27 DIAGNOSIS — N898 Other specified noninflammatory disorders of vagina: Secondary | ICD-10-CM | POA: Insufficient documentation

## 2019-09-04 LAB — MOLECULAR ANCILLARY ONLY
Bacterial Vaginitis (gardnerella): NEGATIVE
Candida Glabrata: NEGATIVE
Candida Vaginitis: POSITIVE — AB
Chlamydia: NEGATIVE
Comment: NEGATIVE
Comment: NEGATIVE
Comment: NEGATIVE
Comment: NEGATIVE
Comment: NEGATIVE
Comment: NORMAL
Neisseria Gonorrhea: NEGATIVE
Trichomonas: NEGATIVE

## 2020-02-26 ENCOUNTER — Other Ambulatory Visit: Payer: Self-pay | Admitting: Family Medicine

## 2020-02-26 ENCOUNTER — Other Ambulatory Visit (HOSPITAL_COMMUNITY)
Admission: RE | Admit: 2020-02-26 | Discharge: 2020-02-26 | Disposition: A | Discharge status: Home / Self Care | Payer: 59 | Source: Ambulatory Visit | Attending: Family Medicine | Admitting: Family Medicine

## 2020-02-26 DIAGNOSIS — N898 Other specified noninflammatory disorders of vagina: Secondary | ICD-10-CM | POA: Insufficient documentation

## 2020-02-26 DIAGNOSIS — N760 Acute vaginitis: Secondary | ICD-10-CM | POA: Diagnosis not present

## 2020-02-26 DIAGNOSIS — Z113 Encounter for screening for infections with a predominantly sexual mode of transmission: Secondary | ICD-10-CM | POA: Diagnosis not present

## 2020-02-26 DIAGNOSIS — B373 Candidiasis of vulva and vagina: Secondary | ICD-10-CM | POA: Insufficient documentation

## 2020-02-29 LAB — MOLECULAR ANCILLARY ONLY
Bacterial Vaginitis (gardnerella): NEGATIVE
Candida Glabrata: NEGATIVE
Candida Vaginitis: POSITIVE — AB
Chlamydia: NEGATIVE
Comment: NEGATIVE
Comment: NEGATIVE
Comment: NEGATIVE
Comment: NEGATIVE
Comment: NEGATIVE
Comment: NORMAL
Neisseria Gonorrhea: NEGATIVE
Trichomonas: NEGATIVE

## 2020-04-15 ENCOUNTER — Other Ambulatory Visit (HOSPITAL_COMMUNITY)
Admission: RE | Admit: 2020-04-15 | Discharge: 2020-04-15 | Disposition: A | Discharge status: Home / Self Care | Payer: 59 | Source: Ambulatory Visit | Attending: Family Medicine | Admitting: Family Medicine

## 2020-04-15 DIAGNOSIS — B373 Candidiasis of vulva and vagina: Secondary | ICD-10-CM | POA: Insufficient documentation

## 2020-04-15 DIAGNOSIS — N898 Other specified noninflammatory disorders of vagina: Secondary | ICD-10-CM | POA: Insufficient documentation

## 2020-04-18 LAB — MOLECULAR ANCILLARY ONLY
Bacterial Vaginitis (gardnerella): NEGATIVE
Candida Glabrata: NEGATIVE
Candida Vaginitis: POSITIVE — AB
Chlamydia: NEGATIVE
Comment: NEGATIVE
Comment: NEGATIVE
Comment: NEGATIVE
Comment: NEGATIVE
Comment: NEGATIVE
Comment: NORMAL
Neisseria Gonorrhea: NEGATIVE
Trichomonas: NEGATIVE

## 2020-07-25 ENCOUNTER — Other Ambulatory Visit (HOSPITAL_COMMUNITY)
Admission: RE | Admit: 2020-07-25 | Discharge: 2020-07-25 | Disposition: A | Discharge status: Home / Self Care | Payer: 59 | Source: Ambulatory Visit | Attending: Family Medicine | Admitting: Family Medicine

## 2020-07-25 DIAGNOSIS — N898 Other specified noninflammatory disorders of vagina: Secondary | ICD-10-CM | POA: Diagnosis present

## 2020-07-29 IMAGING — DX LEFT KNEE 3 VIEWS
3 series · 3 of 3 positions shown · non-contrast
Comparison: None.

CLINICAL DATA: Medial LEFT knee pain for 1 week, question
over-extension during workout, pain soreness and swelling, hurts to
flex and extend

EXAM:
LEFT KNEE 3 VIEWS

[knee ap]
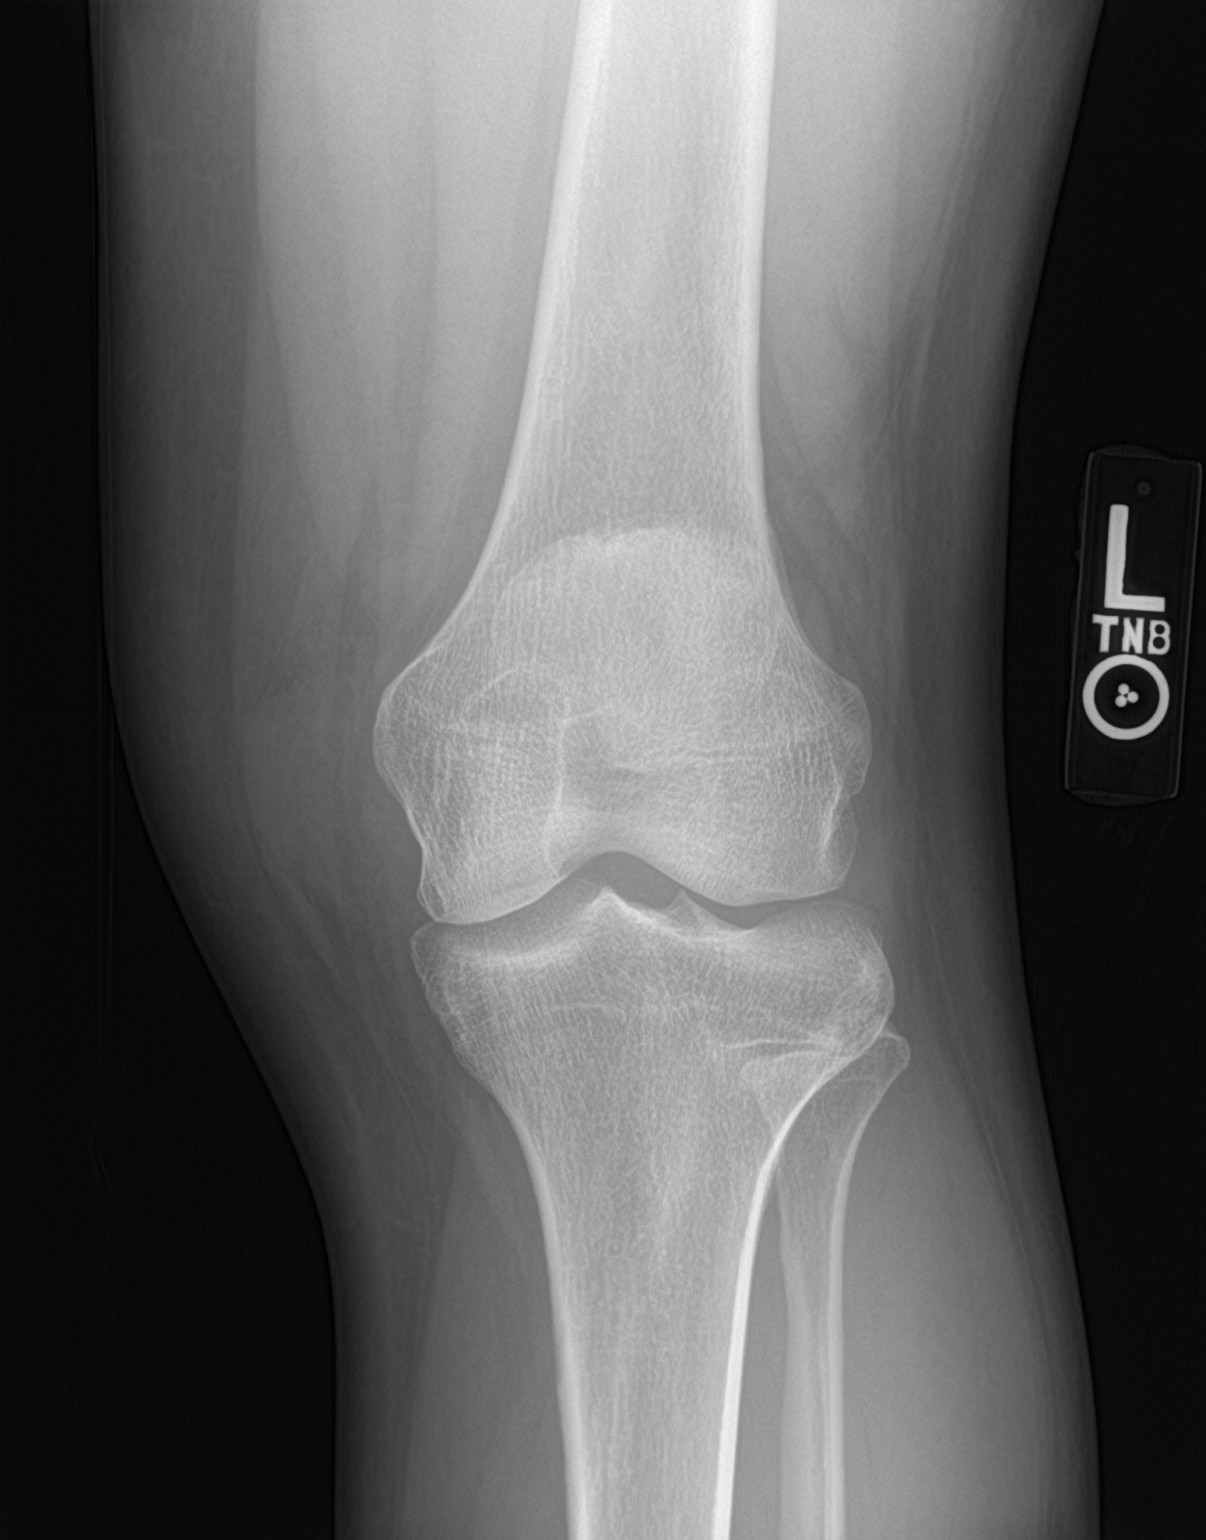

[knee lat]
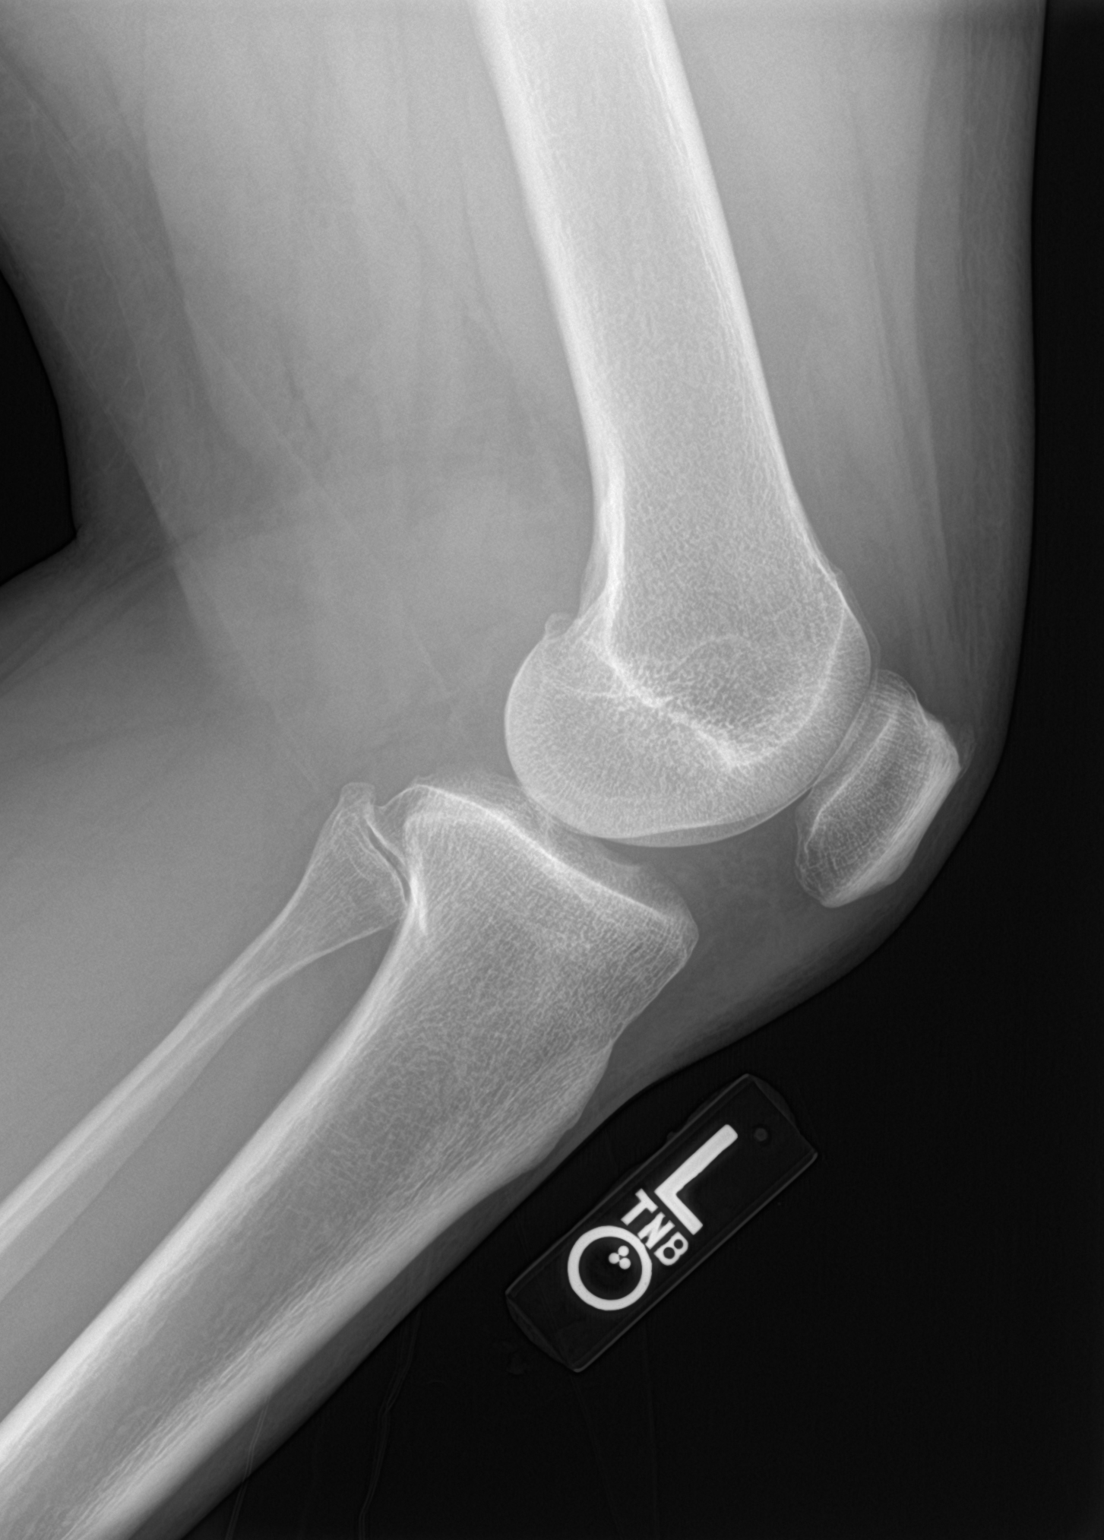

[knee sunrise]
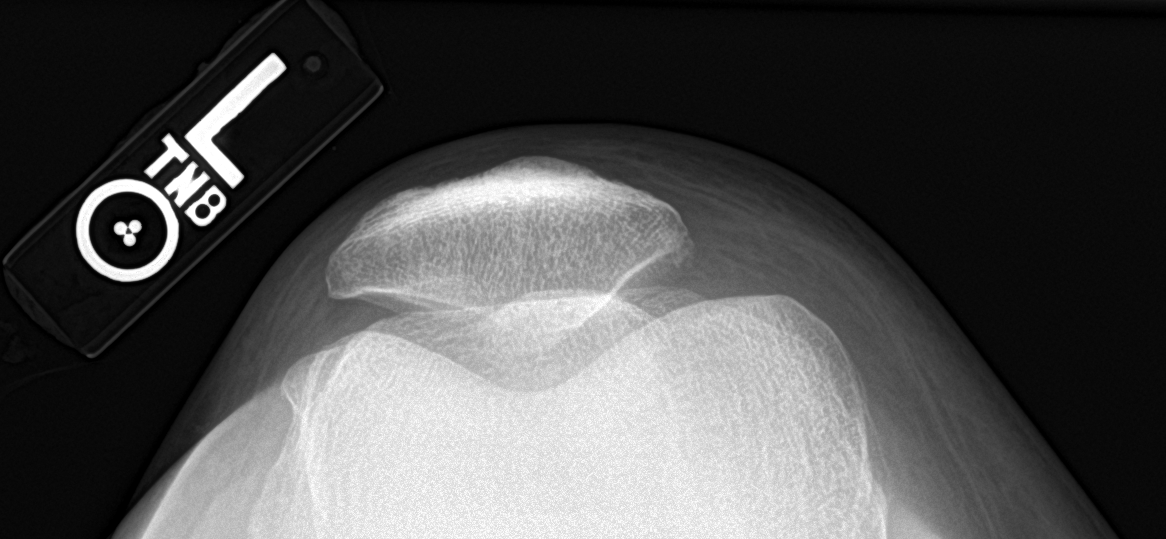

[3 of 3 positions shown; findings below may reference images not displayed]

FINDINGS: Osseous mineralization normal.

Mild joint space narrowing.

No acute fracture, dislocation or bone destruction.

Tiny patellar spur at quadriceps tendon insertion.

No joint effusion.
IMPRESSION: No acute abnormalities.

## 2020-08-01 LAB — MOLECULAR ANCILLARY ONLY
Bacterial Vaginitis (gardnerella): NEGATIVE
Candida Glabrata: NEGATIVE
Candida Vaginitis: NEGATIVE
Chlamydia: NEGATIVE
Comment: NEGATIVE
Comment: NEGATIVE
Comment: NEGATIVE
Comment: NEGATIVE
Comment: NEGATIVE
Comment: NEGATIVE
Comment: NORMAL
HSV1: NEGATIVE
HSV2: NEGATIVE
Neisseria Gonorrhea: NEGATIVE
Trichomonas: NEGATIVE

## 2020-08-30 ENCOUNTER — Other Ambulatory Visit: Payer: Self-pay

## 2020-08-30 ENCOUNTER — Ambulatory Visit (HOSPITAL_COMMUNITY)
Admission: EM | Admit: 2020-08-30 | Discharge: 2020-08-30 | Disposition: A | Discharge status: Home / Self Care | Payer: 59 | Attending: Family Medicine | Admitting: Family Medicine

## 2020-08-30 ENCOUNTER — Encounter (HOSPITAL_COMMUNITY): Payer: Self-pay

## 2020-08-30 DIAGNOSIS — F17290 Nicotine dependence, other tobacco product, uncomplicated: Secondary | ICD-10-CM | POA: Insufficient documentation

## 2020-08-30 DIAGNOSIS — Z113 Encounter for screening for infections with a predominantly sexual mode of transmission: Secondary | ICD-10-CM | POA: Insufficient documentation

## 2020-08-30 DIAGNOSIS — Z79899 Other long term (current) drug therapy: Secondary | ICD-10-CM | POA: Diagnosis not present

## 2020-08-30 DIAGNOSIS — N760 Acute vaginitis: Secondary | ICD-10-CM | POA: Insufficient documentation

## 2020-08-30 LAB — HIV ANTIBODY (ROUTINE TESTING W REFLEX): HIV Screen 4th Generation wRfx: NONREACTIVE

## 2020-08-30 LAB — RPR: RPR Ser Ql: NONREACTIVE

## 2020-08-30 MED ORDER — FLUCONAZOLE 150 MG PO TABS: 150.0000 mg | ORAL_TABLET | Freq: Once | ORAL | 0 refills | Status: AC

## 2020-08-30 NOTE — ED Provider Notes (Signed)
[facility]    CSN: [id number] Arrival date & time: 08/30/20  2505      History   Chief Complaint Chief Complaint  Patient presents with  . SEXUALLY TRANSMITTED DISEASE    HPI Rebecca Moody is a 45 y.o. female.   Patient here today with 2-day history of vaginal discomfort, irritation.  Possibly some discharge at times but mostly more of a dry itchy feeling.  Denies back pain, abdominal pain, dysuria, hematuria, urinary frequency, fever, chills, lesions or rashes.  States she uses a scented feminine spray after she works out every day and thinks this may be irritating her.  Per patient she gets yeast infections frequently from all of her working out.  Has not tried anything over-the-counter for symptoms thus far.  No new sexual partners but would like to get STD testing for reassurance and screening purposes.    Past Medical History:  Diagnosis Date  . AMA (advanced maternal age) multigravida 35+   . H/O varicella   . Hypertension     Patient Active Problem List   Diagnosis Date Noted  . Accelerated hypertension 12/23/2013  . Normal pregnancy 09/27/2011    History reviewed. No pertinent surgical history.  OB History    Gravida  3   Para  2   Term  2   Preterm  0   AB  1   Living  2     SAB  1   IAB  0   Ectopic  0   Multiple  0   Live Births  2            Home Medications    Prior to Admission medications   Medication Sig Start Date End Date Taking? Authorizing Provider  fluconazole (DIFLUCAN) 150 MG tablet Take 1 tablet (150 mg total) by mouth once for 1 dose. 08/30/20 08/30/20 Yes Particia Nearing, PA-C  amLODipine (NORVASC) 2.5 MG tablet TAKE 1 TABLET(2.5 MG) BY MOUTH DAILY Patient taking differently: 5 mg daily 04/21/16   Porfirio Oar, PA  azithromycin (ZITHROMAX) 250 MG tablet Take 1 tablet (250 mg total) by mouth daily. Take first 2 tablets together, then 1 every day until finished. 06/21/19   Darr, Gerilyn Pilgrim, PA-C   benzonatate (TESSALON) 100 MG capsule Take 1 capsule (100 mg total) by mouth every 8 (eight) hours. 06/21/19   Darr, Gerilyn Pilgrim, PA-C  diazepam (VALIUM) 5 MG tablet 1 PO 1 hour before MRI, repeat prn 11/21/18   Hilts, Casimiro Needle, MD  doxycycline (VIBRAMYCIN) 100 MG capsule Take 1 capsule (100 mg total) by mouth 2 (two) times daily. 05/25/19   Moshe Cipro, NP  guaiFENesin-dextromethorphan (ROBITUSSIN DM) 100-10 MG/5ML syrup Take 5 mLs by mouth every 4 (four) hours as needed for cough. 06/21/19   Darr, Gerilyn Pilgrim, PA-C  hydrochlorothiazide (MICROZIDE) 12.5 MG capsule TAKE 1 CAPSULE(12.5 MG) BY MOUTH DAILY 04/21/16   Porfirio Oar, PA  metroNIDAZOLE (FLAGYL) 500 MG tablet Take 1 tablet (500 mg total) by mouth 2 (two) times daily. 12/05/18   Mickie Bail, NP  Semaglutide (OZEMPIC) 1 MG/DOSE SOPN Inject into the skin.    [provider]  dicyclomine (BENTYL) 20 MG tablet Take 1 tablet (20 mg total) by mouth 4 (four) times daily. 07/07/18 10/17/18  Janace Aris, NP    Family History Family History  Problem Relation Age of Onset  . Diabetes Father   . Stroke Father   . Healthy Mother     Social History Social History  Tobacco Use  . Smoking status: Current Some Day Smoker    Types: Cigars  . Smokeless tobacco: Never Used  Vaping Use  . Vaping Use: Never used  Substance Use Topics  . Alcohol use: Not Currently    Comment: occasionally  . Drug use: No     Allergies   Patient has no known allergies.   Review of Systems Review of Systems Per HPI  Physical Exam Triage Vital Signs ED Triage Vitals  Enc Vitals Group     BP 08/30/20 0825 (!) 147/90     Pulse Rate 08/30/20 0825 64     Resp 08/30/20 0825 20     Temp 08/30/20 0825 98.7 F (37.1 C)     Temp Source 08/30/20 0825 Oral     SpO2 08/30/20 0825 97 %     Weight --      Height --      Head Circumference --      Peak Flow --      Pain Score 08/30/20 0824 0     Pain Loc --      Pain Edu? --      Excl. in GC? --     No data found.  Updated Vital Signs BP (!) 147/90 (BP Location: Left Arm)   Pulse 64   Temp 98.7 F (37.1 C) (Oral)   Resp 20   SpO2 97%   Visual Acuity Right Eye Distance:   Left Eye Distance:   Bilateral Distance:    Right Eye Near:   Left Eye Near:    Bilateral Near:     Physical Exam Vitals and nursing note reviewed.  Constitutional:      Appearance: Normal appearance. She is not ill-appearing.  HENT:     Head: Atraumatic.     Mouth/Throat:     Mouth: Mucous membranes are moist.  Eyes:     Extraocular Movements: Extraocular movements intact.     Conjunctiva/sclera: Conjunctivae normal.  Cardiovascular:     Rate and Rhythm: Normal rate and regular rhythm.     Heart sounds: Normal heart sounds.  Pulmonary:     Effort: Pulmonary effort is normal.     Breath sounds: Normal breath sounds.  Abdominal:     General: Bowel sounds are normal. There is no distension.     Palpations: Abdomen is soft.     Tenderness: There is no abdominal tenderness. There is no guarding.  Genitourinary:    Comments: GU exam deferred, self swab performed Musculoskeletal:        General: Normal range of motion.     Cervical back: Normal range of motion and neck supple.  Skin:    General: Skin is warm and dry.     Findings: No rash.  Neurological:     Mental Status: She is alert and oriented to person, place, and time.  Psychiatric:        Mood and Affect: Mood normal.        Thought Content: Thought content normal.        Judgment: Judgment normal.      UC Treatments / Results  Labs (all labs ordered are listed, but only abnormal results are displayed) Labs Reviewed  HIV ANTIBODY (ROUTINE TESTING W REFLEX)  RPR  CERVICOVAGINAL ANCILLARY ONLY    EKG   Radiology No results found.  Procedures Procedures (including critical care time)  Medications Ordered in UC Medications - No data to display  Initial Impression / Assessment and Plan /  UC Course  I have reviewed  the triage vital signs and the nursing notes.  Pertinent labs & imaging results that were available during my care of the patient were reviewed by me and considered in my medical decision making (see chart for details).     Vitals and exam very reassuring today, vaginal swab and HIV, syphilis blood work pending for screening purposes.  Suspect irritation from the feminine sprays that she is using and discussed DC of these.  Boric acid, probiotics, good vaginal hygiene and safe sexual practices all reviewed today.  We will treat with 1 Diflucan tab while waiting for results as she is prone to yeast infections though suspect BV from use of feminine products.  Adjust treatment based on results once available.  Final Clinical Impressions(s) / UC Diagnoses   Final diagnoses:  Acute vaginitis  Screening for STD (sexually transmitted disease)   Discharge Instructions   None    ED Prescriptions    Medication Sig Dispense Auth. Provider   fluconazole (DIFLUCAN) 150 MG tablet Take 1 tablet (150 mg total) by mouth once for 1 dose. 1 tablet Particia Nearing, [place]     PDMP not reviewed this encounter.   Particia Nearing, [place] 08/30/20 1000

## 2020-08-30 NOTE — ED Triage Notes (Signed)
Pt c/o vaginal discomfort X 2 days. Pt denies back pain, abdominal pain and vaginal discharge.

## 2020-08-31 LAB — CERVICOVAGINAL ANCILLARY ONLY
Bacterial Vaginitis (gardnerella): NEGATIVE
Candida Glabrata: NEGATIVE
Candida Vaginitis: NEGATIVE
Chlamydia: NEGATIVE
Comment: NEGATIVE
Comment: NEGATIVE
Comment: NEGATIVE
Comment: NEGATIVE
Comment: NEGATIVE
Comment: NORMAL
Neisseria Gonorrhea: NEGATIVE
Trichomonas: NEGATIVE

## 2021-02-24 ENCOUNTER — Other Ambulatory Visit: Payer: Self-pay | Admitting: Family Medicine

## 2021-02-24 ENCOUNTER — Other Ambulatory Visit (HOSPITAL_COMMUNITY)
Admission: RE | Admit: 2021-02-24 | Discharge: 2021-02-24 | Disposition: A | Discharge status: Home / Self Care | Payer: 59 | Source: Ambulatory Visit | Attending: Family Medicine | Admitting: Family Medicine

## 2021-02-24 DIAGNOSIS — N898 Other specified noninflammatory disorders of vagina: Secondary | ICD-10-CM | POA: Diagnosis present

## 2021-02-27 LAB — MOLECULAR ANCILLARY ONLY
Bacterial Vaginitis (gardnerella): NEGATIVE
Candida Glabrata: NEGATIVE
Candida Vaginitis: NEGATIVE
Chlamydia: NEGATIVE
Comment: NEGATIVE
Comment: NEGATIVE
Comment: NEGATIVE
Comment: NEGATIVE
Comment: NEGATIVE
Comment: NORMAL
Neisseria Gonorrhea: NEGATIVE
Trichomonas: NEGATIVE

## 2021-05-20 ENCOUNTER — Ambulatory Visit (HOSPITAL_COMMUNITY)
Admission: EM | Admit: 2021-05-20 | Discharge: 2021-05-20 | Disposition: A | Discharge status: Home / Self Care | Payer: 59 | Attending: Physician Assistant | Admitting: Physician Assistant

## 2021-05-20 ENCOUNTER — Other Ambulatory Visit: Payer: Self-pay

## 2021-05-20 ENCOUNTER — Encounter (HOSPITAL_COMMUNITY): Payer: Self-pay | Admitting: Emergency Medicine

## 2021-05-20 DIAGNOSIS — Z113 Encounter for screening for infections with a predominantly sexual mode of transmission: Secondary | ICD-10-CM | POA: Insufficient documentation

## 2021-05-20 DIAGNOSIS — N898 Other specified noninflammatory disorders of vagina: Secondary | ICD-10-CM | POA: Insufficient documentation

## 2021-05-20 MED ORDER — FLUCONAZOLE 150 MG PO TABS: 150.0000 mg | ORAL_TABLET | ORAL | 0 refills | Status: DC | PRN

## 2021-05-20 NOTE — ED Triage Notes (Signed)
Pt reports vaginal discomfort x 4 days and "yellow" discharge x 1 day. Requesting to be swabbed for STD's.

## 2021-05-20 NOTE — ED Provider Notes (Signed)
[facility]    CSN: [id number] Arrival date & time: 05/20/21  1033      History   Chief Complaint Chief Complaint  Patient presents with   SEXUALLY TRANSMITTED DISEASE    HPI Rebecca Moody is a 46 y.o. female.   Patient presents today with a 4-day history of vaginal irritation.  She reports discomfort and itching.  She reports associated thick discharge.  She is sexually active and is interested in STI panel.  She is sexually active with female partners and denies any specific concern for STI.  She denies any change in personal hygiene products including soaps or detergents.  She denies intention to use a condom brand or new undergarments.  She denies any pelvic pain, abdominal pain, fever, nausea, vomiting.  Denies any urinary symptoms.  She denies any recent antibiotic use.  Denies history of diabetes or immunosuppression.  She does not take any SGLT 2 inhibitors.  She is confident she is not pregnant.   Past Medical History:  Diagnosis Date   AMA (advanced maternal age) multigravida 35+    H/O varicella    Hypertension     Patient Active Problem List   Diagnosis Date Noted   Accelerated hypertension 12/23/2013   Normal pregnancy 09/27/2011    History reviewed. No pertinent surgical history.  OB History     Gravida  3   Para  2   Term  2   Preterm  0   AB  1   Living  2      SAB  1   IAB  0   Ectopic  0   Multiple  0   Live Births  2            Home Medications    Prior to Admission medications   Medication Sig Start Date End Date Taking? Authorizing Provider  fluconazole (DIFLUCAN) 150 MG tablet Take 1 tablet (150 mg total) by mouth every 3 (three) days as needed for up to 3 doses. 05/20/21  Yes Dewon Mendizabal K, PA-C  amLODipine (NORVASC) 2.5 MG tablet TAKE 1 TABLET(2.5 MG) BY MOUTH DAILY Patient taking differently: 5 mg daily 04/21/16   Porfirio Oar, PA  azithromycin (ZITHROMAX) 250 MG tablet Take 1 tablet (250 mg total) by  mouth daily. Take first 2 tablets together, then 1 every day until finished. 06/21/19   Darr, Gerilyn Pilgrim, PA-C  benzonatate (TESSALON) 100 MG capsule Take 1 capsule (100 mg total) by mouth every 8 (eight) hours. 06/21/19   Darr, Gerilyn Pilgrim, PA-C  diazepam (VALIUM) 5 MG tablet 1 PO 1 hour before MRI, repeat prn 11/21/18   Hilts, Casimiro Needle, MD  doxycycline (VIBRAMYCIN) 100 MG capsule Take 1 capsule (100 mg total) by mouth 2 (two) times daily. 05/25/19   Moshe Cipro, NP  guaiFENesin-dextromethorphan (ROBITUSSIN DM) 100-10 MG/5ML syrup Take 5 mLs by mouth every 4 (four) hours as needed for cough. 06/21/19   Darr, Gerilyn Pilgrim, PA-C  hydrochlorothiazide (MICROZIDE) 12.5 MG capsule TAKE 1 CAPSULE(12.5 MG) BY MOUTH DAILY 04/21/16   Porfirio Oar, PA  metroNIDAZOLE (FLAGYL) 500 MG tablet Take 1 tablet (500 mg total) by mouth 2 (two) times daily. 12/05/18   Mickie Bail, NP  Semaglutide (OZEMPIC) 1 MG/DOSE SOPN Inject into the skin.    [provider]  dicyclomine (BENTYL) 20 MG tablet Take 1 tablet (20 mg total) by mouth 4 (four) times daily. 07/07/18 10/17/18  Janace Aris, NP    Family History Family History  Problem  Relation Age of Onset   Diabetes Father    Stroke Father    Healthy Mother     Social History Social History   Tobacco Use   Smoking status: Some Days    Types: Cigars   Smokeless tobacco: Never  Vaping Use   Vaping Use: Never used  Substance Use Topics   Alcohol use: Not Currently    Comment: occasionally   Drug use: No     Allergies   Patient has no known allergies.   Review of Systems Review of Systems  Constitutional:  Positive for activity change. Negative for appetite change, fatigue and fever.  Respiratory:  Negative for cough and shortness of breath.   Cardiovascular:  Negative for chest pain.  Gastrointestinal:  Negative for abdominal pain, diarrhea, nausea and vomiting.  Genitourinary:  Positive for vaginal pain (irritation). Negative for dysuria, frequency,  pelvic pain, urgency, vaginal bleeding and vaginal discharge.  Neurological:  Negative for dizziness, light-headedness and headaches.    Physical Exam Triage Vital Signs ED Triage Vitals  Enc Vitals Group     BP 05/20/21 1135 134/82     Pulse Rate 05/20/21 1135 70     Resp 05/20/21 1135 16     Temp 05/20/21 1135 98.5 F (36.9 C)     Temp Source 05/20/21 1135 Oral     SpO2 05/20/21 1135 98 %     Weight 05/20/21 1134 210 lb 1.6 oz (95.3 kg)     Height 05/20/21 1134 5\' 6"  (1.676 m)     Head Circumference --      Peak Flow --      Pain Score 05/20/21 1134 0     Pain Loc --      Pain Edu? --      Excl. in GC? --    No data found.  Updated Vital Signs BP 134/82 (BP Location: Left Arm)    Pulse 70    Temp 98.5 F (36.9 C) (Oral)    Resp 16    Ht 5\' 6"  (1.676 m)    Wt 210 lb 1.6 oz (95.3 kg)    SpO2 98%    BMI 33.91 kg/m   Visual Acuity Right Eye Distance:   Left Eye Distance:   Bilateral Distance:    Right Eye Near:   Left Eye Near:    Bilateral Near:     Physical Exam Vitals reviewed.  Constitutional:      General: She is awake. She is not in acute distress.    Appearance: Normal appearance. She is well-developed. She is not ill-appearing.     Comments: Very pleasant female appears stated age in no acute distress sitting comfortably in exam room  HENT:     Head: Normocephalic and atraumatic.  Cardiovascular:     Rate and Rhythm: Normal rate and regular rhythm.     Heart sounds: Normal heart sounds, S1 normal and S2 normal. No murmur heard. Pulmonary:     Effort: Pulmonary effort is normal.     Breath sounds: Normal breath sounds. No wheezing, rhonchi or rales.     Comments: Clear to auscultation bilaterally Abdominal:     General: Bowel sounds are normal.     Palpations: Abdomen is soft.     Tenderness: There is no abdominal tenderness. There is no right CVA tenderness, left CVA tenderness, guarding or rebound.     Comments: Benign abdominal exam   Genitourinary:    Comments: Exam deferred Psychiatric:  Behavior: Behavior is cooperative.     UC Treatments / Results  Labs (all labs ordered are listed, but only abnormal results are displayed) Labs Reviewed  CERVICOVAGINAL ANCILLARY ONLY    EKG   Radiology No results found.  Procedures Procedures (including critical care time)  Medications Ordered in UC Medications - No data to display  Initial Impression / Assessment and Plan / UC Course  I have reviewed the triage vital signs and the nursing notes.  Pertinent labs & imaging results that were available during my care of the patient were reviewed by me and considered in my medical decision making (see chart for details).     STI swab collected today-results pending.  Will empirically treat for yeast given associated irritation with Diflucan up to 3 doses spaced 72 hours apart.  Recommended she use hypoallergenic soaps and detergents and wear loosefitting cotton underwear.  Offered HIV/hepatitis/RPR testing which she declined as she is scheduled to have her annual with her OB/GYN in the near future and will have this done at this time.  Discussed that if she has any worsening symptoms including abdominal pain, pelvic pain, fever, nausea, vomiting she needs to be seen immediately.  Recommended follow-up with PCP or OB/GYN if symptoms do not improve quickly with medication.  Strict return precautions given to which she expressed understanding.  Final Clinical Impressions(s) / UC Diagnoses   Final diagnoses:  Vaginal irritation  Routine screening for STI (sexually transmitted infection)     Discharge Instructions      Take Diflucan 1 dose every 3 days up to 3 doses or until symptoms resolve.  Use loosefitting cotton underwear and hypoallergenic soaps and detergents to help manage symptoms.  We will contact you if we need to arrange additional treatment based on your lab results.  If you have any worsening symptoms  including abdominal pain, pelvic pain, fever, nausea/vomiting you should be reevaluated immediately.  Follow-up with your OB/GYN as scheduled.     ED Prescriptions     Medication Sig Dispense Auth. Provider   fluconazole (DIFLUCAN) 150 MG tablet Take 1 tablet (150 mg total) by mouth every 3 (three) days as needed for up to 3 doses. 3 tablet Kierstin January, Noberto Retort, PA-C      PDMP not reviewed this encounter.   Jeani Hawking, PA-C 05/20/21 1153

## 2021-05-20 NOTE — Discharge Instructions (Signed)
Take Diflucan 1 dose every 3 days up to 3 doses or until symptoms resolve.  Use loosefitting cotton underwear and hypoallergenic soaps and detergents to help manage symptoms.  We will contact you if we need to arrange additional treatment based on your lab results.  If you have any worsening symptoms including abdominal pain, pelvic pain, fever, nausea/vomiting you should be reevaluated immediately.  Follow-up with your OB/GYN as scheduled.

## 2021-05-22 LAB — CERVICOVAGINAL ANCILLARY ONLY
Bacterial Vaginitis (gardnerella): NEGATIVE
Candida Glabrata: NEGATIVE
Candida Vaginitis: NEGATIVE
Chlamydia: NEGATIVE
Comment: NEGATIVE
Comment: NEGATIVE
Comment: NEGATIVE
Comment: NEGATIVE
Comment: NEGATIVE
Comment: NORMAL
Neisseria Gonorrhea: NEGATIVE
Trichomonas: NEGATIVE

## 2021-09-29 ENCOUNTER — Ambulatory Visit (HOSPITAL_COMMUNITY)
Admission: EM | Admit: 2021-09-29 | Discharge: 2021-09-29 | Disposition: A | Discharge status: Home / Self Care | Payer: 59 | Attending: Family Medicine | Admitting: Family Medicine

## 2021-09-29 ENCOUNTER — Encounter (HOSPITAL_COMMUNITY): Payer: Self-pay | Admitting: Emergency Medicine

## 2021-09-29 DIAGNOSIS — Z202 Contact with and (suspected) exposure to infections with a predominantly sexual mode of transmission: Secondary | ICD-10-CM | POA: Insufficient documentation

## 2021-09-29 MED ORDER — DOXYCYCLINE HYCLATE 100 MG PO CAPS: 100.0000 mg | ORAL_CAPSULE | Freq: Two times a day (BID) | ORAL | 0 refills | Status: AC

## 2021-10-02 LAB — CERVICOVAGINAL ANCILLARY ONLY
Bacterial Vaginitis (gardnerella): NEGATIVE
Candida Glabrata: NEGATIVE
Candida Vaginitis: NEGATIVE
Chlamydia: NEGATIVE
Comment: NEGATIVE
Comment: NEGATIVE
Comment: NEGATIVE
Comment: NEGATIVE
Comment: NEGATIVE
Comment: NORMAL
Neisseria Gonorrhea: NEGATIVE
Trichomonas: NEGATIVE

## 2021-10-02 LAB — CYTOLOGY, (ORAL, ANAL, URETHRAL) ANCILLARY ONLY
Chlamydia: NEGATIVE
Comment: NEGATIVE
Comment: NEGATIVE
Comment: NORMAL
Neisseria Gonorrhea: NEGATIVE
Trichomonas: NEGATIVE

## 2021-11-04 ENCOUNTER — Encounter (HOSPITAL_COMMUNITY): Payer: Self-pay | Admitting: Emergency Medicine

## 2021-11-04 ENCOUNTER — Ambulatory Visit (HOSPITAL_COMMUNITY)
Admission: EM | Admit: 2021-11-04 | Discharge: 2021-11-04 | Disposition: A | Discharge status: Home / Self Care | Payer: 59 | Attending: Physician Assistant | Admitting: Physician Assistant

## 2021-11-04 ENCOUNTER — Other Ambulatory Visit: Payer: Self-pay

## 2021-11-04 DIAGNOSIS — H5789 Other specified disorders of eye and adnexa: Secondary | ICD-10-CM | POA: Insufficient documentation

## 2021-11-04 DIAGNOSIS — Z113 Encounter for screening for infections with a predominantly sexual mode of transmission: Secondary | ICD-10-CM | POA: Diagnosis not present

## 2021-11-04 MED ORDER — FLUORESCEIN SODIUM 1 MG OP STRP
ORAL_STRIP | OPHTHALMIC | Status: AC
  Filled 2021-11-04: qty 1

## 2021-11-04 MED ORDER — TETRACAINE HCL 0.5 % OP SOLN
OPHTHALMIC | Status: AC
  Filled 2021-11-04: qty 4

## 2021-11-04 NOTE — Discharge Instructions (Addendum)
I am concerned that you have something called uveitis.  I have called the eye doctor on-call (Dr. Dione Booze) he would like to see you at 5:00 today.  Please go to his office as scheduled.

## 2021-11-04 NOTE — ED Triage Notes (Addendum)
Woke with right eye redness, photosensitivity.  Patient does not wear contacts.  Patient does wear prescription glasses.  Reports vision is blurry.  No known injury.    Patient also requesting an std swab secondary to irritation since she is here.

## 2021-11-04 NOTE — ED Provider Notes (Signed)
[facility]    CSN: [id number] Arrival date & time: 11/04/21  1340      History   Chief Complaint Chief Complaint  Patient presents with   Eye Problem    HPI Rebecca Moody is a 46 y.o. female.   Patient presents today with a 1 day history of right eye redness and irritation.  She does report associated blurry vision.  Denies any headache, nausea, vomiting.  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  She is unsure what this condition is.  She denies history of glaucoma.  She denies history of rheumatoid disorders or history of uveitis.  She does not believe that she injured the eye or had anything in it.  She did have her dog sleep with her last night.  She denies any significant drainage but does report photophobia.  She denies any recent illness or medication changes.  Denies history of diabetes.  In addition, patient is requesting STI swab due to some vaginal irritation.  Denies any pelvic pain, abdominal pain, significant discharge.    Past Medical History:  Diagnosis Date   AMA (advanced maternal age) multigravida 35+    H/O varicella    Hypertension     Patient Active Problem List   Diagnosis Date Noted   Accelerated hypertension 12/23/2013   Normal pregnancy 09/27/2011    History reviewed. No pertinent surgical history.  OB History     Gravida  3   Para  2   Term  2   Preterm  0   AB  1   Living  2      SAB  1   IAB  0   Ectopic  0   Multiple  0   Live Births  2            Home Medications    Prior to Admission medications   Medication Sig Start Date End Date Taking? Authorizing Provider  amLODipine (NORVASC) 2.5 MG tablet TAKE 1 TABLET(2.5 MG) BY MOUTH DAILY Patient taking differently: 5 mg daily 04/21/16   Porfirio Oar, PA  diazepam (VALIUM) 5 MG tablet 1 PO 1 hour before MRI, repeat prn 11/21/18   Hilts, Michael, MD  hydrochlorothiazide (MICROZIDE) 12.5  MG capsule TAKE 1 CAPSULE(12.5 MG) BY MOUTH DAILY 04/21/16   Porfirio Oar, PA  levonorgestrel (MIRENA) 20 MCG/DAY IUD 1 each by Intrauterine route once.    [provider]  Semaglutide (OZEMPIC) 1 MG/DOSE SOPN Inject into the skin. Patient not taking: Reported on 11/04/2021    [provider]  dicyclomine (BENTYL) 20 MG tablet Take 1 tablet (20 mg total) by mouth 4 (four) times daily. 07/07/18 10/17/18  Janace Aris, NP    Family History Family History  Problem Relation Age of Onset   Diabetes Father    Stroke Father    Healthy Mother     Social History Social History   Tobacco Use   Smoking status: Some Days    Types: Cigars   Smokeless tobacco: Never  Vaping Use   Vaping Use: Never used  Substance Use Topics   Alcohol use: Not Currently    Comment: occasionally   Drug use: Not Currently    Types: Marijuana     Allergies   Patient has no known allergies.   Review of Systems Review of Systems  Constitutional:  Negative for activity change, appetite change, fatigue and fever.  Eyes:  Positive for  photophobia, pain, redness and visual disturbance. Negative for discharge and itching.  Gastrointestinal:  Negative for abdominal pain, diarrhea, nausea and vomiting.  Neurological:  Negative for dizziness, light-headedness and headaches.     Physical Exam Triage Vital Signs ED Triage Vitals  Enc Vitals Group     BP 11/04/21 1421 (!) 150/97     Pulse Rate 11/04/21 1421 70     Resp 11/04/21 1421 20     Temp 11/04/21 1421 98.9 F (37.2 C)     Temp Source 11/04/21 1421 Oral     SpO2 11/04/21 1421 99 %     Weight --      Height --      Head Circumference --      Peak Flow --      Pain Score 11/04/21 1417 7     Pain Loc --      Pain Edu? --      Excl. in GC? --    No data found.  Updated Vital Signs BP (!) 150/97 (BP Location: Left Arm) Comment (BP Location): large cuff  Pulse 70   Temp 98.9 F (37.2 C) (Oral)   Resp 20   SpO2 99%    Visual Acuity Right Eye Distance: 20/70, with glasses Left Eye Distance: 20/25, with glasses Bilateral Distance: 20/25, with glasses  Right Eye Near:   Left Eye Near:    Bilateral Near:     Physical Exam Vitals reviewed.  Constitutional:      General: She is awake. She is not in acute distress.    Appearance: Normal appearance. She is well-developed. She is not ill-appearing.     Comments: Very pleasant female presented to no acute distress sitting comfortably in exam room  HENT:     Head: Normocephalic and atraumatic.  Eyes:     Extraocular Movements: Extraocular movements intact.     Conjunctiva/sclera:     Right eye: Right conjunctiva is injected.     Pupils: Pupils are equal, round, and reactive to light.     Right eye: Pupil is sluggish. No corneal abrasion or fluorescein uptake. Seidel exam negative.     Left eye: Pupil is not sluggish.  Cardiovascular:     Rate and Rhythm: Normal rate and regular rhythm.     Heart sounds: Normal heart sounds, S1 normal and S2 normal. No murmur heard. Pulmonary:     Effort: Pulmonary effort is normal.     Breath sounds: Normal breath sounds. No wheezing, rhonchi or rales.     Comments: Clear to auscultation bilaterally Abdominal:     General: Bowel sounds are normal.     Palpations: Abdomen is soft.     Tenderness: There is no abdominal tenderness. There is no right CVA tenderness, left CVA tenderness, guarding or rebound.  Psychiatric:        Behavior: Behavior is cooperative.      UC Treatments / Results  Labs (all labs ordered are listed, but only abnormal results are displayed) Labs Reviewed  CERVICOVAGINAL ANCILLARY ONLY    EKG   Radiology No results found.  Procedures Procedures (including critical care time)  Medications Ordered in UC Medications - No data to display  Initial Impression / Assessment and Plan / UC Course  I have reviewed the triage vital signs and the nursing notes.  Pertinent labs &  imaging results that were available during my care of the patient were reviewed by me and considered in my medical decision making (see chart for  details).     Concern for uveitis given clinical presentation.  Contacted ophthalmologist on-call (Dr. Dione Booze) who recommended an office evaluation.  He will see her at 5:00 today at his office.  She was given this information and instructed to follow-up with him later today.  If she has any worsening symptoms she is to go to the emergency room in the meantime.  STI swab collected today-results pending.  Will make additional recommendations based on laboratory findings.  She was encouraged to wear loosefitting cotton underwear and use hypoallergenic soaps and detergents.  Final Clinical Impressions(s) / UC Diagnoses   Final diagnoses:  Irritation of right eye  Routine screening for STI (sexually transmitted infection)     Discharge Instructions      I am concerned that you have something called uveitis.  I have called the eye doctor on-call (Dr. Dione Booze) he would like to see you at 5:00 today.  Please go to his office as scheduled.     ED Prescriptions   None    PDMP not reviewed this encounter.   Jeani Hawking, PA-C 11/04/21 1847

## 2021-11-06 LAB — CERVICOVAGINAL ANCILLARY ONLY
Bacterial Vaginitis (gardnerella): NEGATIVE
Candida Glabrata: NEGATIVE
Candida Vaginitis: NEGATIVE
Chlamydia: NEGATIVE
Comment: NEGATIVE
Comment: NEGATIVE
Comment: NEGATIVE
Comment: NEGATIVE
Comment: NEGATIVE
Comment: NORMAL
Neisseria Gonorrhea: NEGATIVE
Trichomonas: NEGATIVE

## 2022-04-20 ENCOUNTER — Other Ambulatory Visit: Payer: Self-pay | Admitting: Family Medicine

## 2022-04-20 ENCOUNTER — Other Ambulatory Visit (HOSPITAL_COMMUNITY)
Admission: RE | Admit: 2022-04-20 | Discharge: 2022-04-20 | Disposition: A | Discharge status: Home / Self Care | Payer: 59 | Source: Ambulatory Visit | Attending: Family Medicine | Admitting: Family Medicine

## 2022-04-20 DIAGNOSIS — Z124 Encounter for screening for malignant neoplasm of cervix: Secondary | ICD-10-CM | POA: Insufficient documentation

## 2022-04-25 LAB — MOLECULAR ANCILLARY ONLY
Bacterial Vaginitis (gardnerella): POSITIVE — AB
Candida Glabrata: NEGATIVE
Candida Vaginitis: NEGATIVE
Chlamydia: NEGATIVE
Comment: NEGATIVE
Comment: NEGATIVE
Comment: NEGATIVE
Comment: NEGATIVE
Comment: NEGATIVE
Comment: NORMAL
Neisseria Gonorrhea: NEGATIVE
Trichomonas: NEGATIVE

## 2022-06-15 ENCOUNTER — Ambulatory Visit (HOSPITAL_COMMUNITY)
Admission: EM | Admit: 2022-06-15 | Discharge: 2022-06-15 | Disposition: A | Discharge status: Home / Self Care | Payer: 59 | Attending: Physician Assistant | Admitting: Physician Assistant

## 2022-06-15 ENCOUNTER — Encounter (HOSPITAL_COMMUNITY): Payer: Self-pay

## 2022-06-15 DIAGNOSIS — N760 Acute vaginitis: Secondary | ICD-10-CM | POA: Insufficient documentation

## 2022-06-15 DIAGNOSIS — I10 Essential (primary) hypertension: Secondary | ICD-10-CM | POA: Diagnosis present

## 2022-06-15 DIAGNOSIS — Z113 Encounter for screening for infections with a predominantly sexual mode of transmission: Secondary | ICD-10-CM | POA: Diagnosis present

## 2022-06-15 DIAGNOSIS — N898 Other specified noninflammatory disorders of vagina: Secondary | ICD-10-CM | POA: Insufficient documentation

## 2022-06-15 LAB — HIV ANTIBODY (ROUTINE TESTING W REFLEX): HIV Screen 4th Generation wRfx: NONREACTIVE

## 2022-06-15 MED ORDER — CLOTRIMAZOLE 3 2 % VA CREA
1.0000 | TOPICAL_CREAM | Freq: Every day | VAGINAL | 0 refills | Status: DC
Start: 2022-06-15 — End: 2022-09-28

## 2022-06-15 MED ORDER — FLUCONAZOLE 150 MG PO TABS
150.0000 mg | ORAL_TABLET | ORAL | 0 refills | Status: DC | PRN
Start: 2022-06-15 — End: 2022-09-28

## 2022-06-15 NOTE — Discharge Instructions (Signed)
Take Diflucan today and a second dose in 3 days if symptoms persist.  Use clotrimazole topically to help with your symptoms.  Wear loosefitting cotton underwear and use hypoallergenic soaps and detergents.  We will contact you if need to arrange any additional treatment.  If you develop any pelvic pain, abdominal pain, fever, nausea, vomiting you need to be seen immediately.  Your blood pressure was elevated.  I believe this is because you are not feeling well.  Please avoid NSAIDs (aspirin, ibuprofen/Advil, naproxen/Aleve), decongestants, caffeine, sodium.  Monitor your blood pressure at home.  Take your medication as prescribed.  If persistently above 140/90 you should return to consider medications.  If you develop any chest pain, shortness of breath, headache, vision change, dizziness in the setting of high blood pressure you need to go to the emergency room.

## 2022-06-15 NOTE — ED Triage Notes (Signed)
Pt c/o white thick vaginal discharge with odor x1wk. States wants STD testing as well.

## 2022-06-15 NOTE — ED Provider Notes (Signed)
[facility]    CSN: [id number] Arrival date & time: 06/15/22  [id number]      History   Chief Complaint Chief Complaint  Patient presents with   Vaginal Discharge    HPI Rebecca Moody is a 47 y.o. female.   Patient presents today with a 1 week history of thick white vaginal discharge.  She reports vaginal irritation and thick white discharge.  She denies any abdominal pain, fever, nausea, vomiting.  She has not tried any over-the-counter medication for symptom management.  She is interested in STI testing but denies any specific exposures.  She denies history of diabetes and does not take SGLT2 inhibitor.  She denies history of immunosuppression or HIV.  She is confident she is not pregnant and she has an IUD.  Her blood pressure slightly elevated.  Reports that she has not taken her antihypertensive medication today.  She also believes that this is worsened as a result of her discomfort from acute symptoms.  She denies any chest pain, shortness of breath, headache, vision change, dizziness.  She has not been taking NSAIDs, decongestants, sodium, caffeine.    Past Medical History:  Diagnosis Date   AMA (advanced maternal age) multigravida 65+    H/O varicella    Hypertension     Patient Active Problem List   Diagnosis Date Noted   Accelerated hypertension 12/23/2013   Normal pregnancy 09/27/2011    History reviewed. No pertinent surgical history.  OB History     Gravida  3   Para  2   Term  2   Preterm  0   AB  1   Living  2      SAB  1   IAB  0   Ectopic  0   Multiple  0   Live Births  2            Home Medications    Prior to Admission medications   Medication Sig Start Date End Date Taking? Authorizing Provider  clotrimazole (CLOTRIMAZOLE 3) 2 % vaginal cream Place 1 Applicatorful vaginally at bedtime. 06/15/22  Yes Briannah Lona K, PA-C  fluconazole (DIFLUCAN) 150 MG tablet Take 1 tablet (150 mg total) by mouth every 3 (three) days  as needed. 06/15/22  Yes Fruma Africa K, PA-C  amLODipine (NORVASC) 2.5 MG tablet TAKE 1 TABLET(2.5 MG) BY MOUTH DAILY Patient taking differently: 5 mg daily 04/21/16   Harrison Mons, PA  diazepam (VALIUM) 5 MG tablet 1 PO 1 hour before MRI, repeat prn 11/21/18   Hilts, Michael, MD  hydrochlorothiazide (MICROZIDE) 12.5 MG capsule TAKE 1 CAPSULE(12.5 MG) BY MOUTH DAILY 04/21/16   Harrison Mons, PA  levonorgestrel (MIRENA) 20 MCG/DAY IUD 1 each by Intrauterine route once.    [provider]  Semaglutide (OZEMPIC) 1 MG/DOSE SOPN Inject into the skin. Patient not taking: Reported on 11/04/2021    [provider]  dicyclomine (BENTYL) 20 MG tablet Take 1 tablet (20 mg total) by mouth 4 (four) times daily. 07/07/18 10/17/18  Orvan July, NP    Family History Family History  Problem Relation Age of Onset   Diabetes Father    Stroke Father    Healthy Mother     Social History Social History   Tobacco Use   Smoking status: Some Days    Types: Cigars   Smokeless tobacco: Never  Vaping Use   Vaping Use: Never used  Substance Use Topics   Alcohol use: Not Currently  Comment: occasionally   Drug use: Not Currently    Types: Marijuana     Allergies   Patient has no known allergies.   Review of Systems Review of Systems  Constitutional:  Positive for activity change. Negative for appetite change, fatigue and fever.  Gastrointestinal:  Negative for abdominal pain, diarrhea, nausea and vomiting.  Genitourinary:  Positive for vaginal discharge and vaginal pain (itching and irritation). Negative for dysuria, frequency, pelvic pain, urgency and vaginal bleeding.     Physical Exam Triage Vital Signs ED Triage Vitals  Enc Vitals Group     BP 06/15/22 0915 (!) 158/109     Pulse Rate 06/15/22 0915 75     Resp 06/15/22 0915 18     Temp 06/15/22 0915 98.7 F (37.1 C)     Temp Source 06/15/22 0915 Oral     SpO2 06/15/22 0915 99 %     Weight --      Height --       Head Circumference --      Peak Flow --      Pain Score 06/15/22 0916 0     Pain Loc --      Pain Edu? --      Excl. in [place]? --    No data found.  Updated Vital Signs BP (!) 158/109 (BP Location: Right Arm)   Pulse 75   Temp 98.7 F (37.1 C) (Oral)   Resp 18   SpO2 99%   Visual Acuity Right Eye Distance:   Left Eye Distance:   Bilateral Distance:    Right Eye Near:   Left Eye Near:    Bilateral Near:     Physical Exam Vitals reviewed.  Constitutional:      General: She is awake. She is not in acute distress.    Appearance: Normal appearance. She is well-developed. She is not ill-appearing.     Comments: Very pleasant female appears stated age in no acute distress sitting comfortably in exam room  HENT:     Head: Normocephalic and atraumatic.     Mouth/Throat:     Pharynx: Uvula midline. No oropharyngeal exudate or posterior oropharyngeal erythema.  Cardiovascular:     Rate and Rhythm: Normal rate and regular rhythm.     Heart sounds: Normal heart sounds, S1 normal and S2 normal. No murmur heard. Pulmonary:     Effort: Pulmonary effort is normal.     Breath sounds: Normal breath sounds. No wheezing, rhonchi or rales.     Comments: Clear to auscultation bilaterally Abdominal:     General: Bowel sounds are normal.     Palpations: Abdomen is soft.     Tenderness: There is no abdominal tenderness. There is no right CVA tenderness, left CVA tenderness, guarding or rebound.  Psychiatric:        Behavior: Behavior is cooperative.      UC Treatments / Results  Labs (all labs ordered are listed, but only abnormal results are displayed) Labs Reviewed  RPR  HIV ANTIBODY (ROUTINE TESTING W REFLEX)  CERVICOVAGINAL ANCILLARY ONLY    EKG   Radiology No results found.  Procedures Procedures (including critical care time)  Medications Ordered in UC Medications - No data to display  Initial Impression / Assessment and Plan / UC Course  I have reviewed the triage  vital signs and the nursing notes.  Pertinent labs & imaging results that were available during my care of the patient were reviewed by me and considered in my  medical decision making (see chart for details).     Patient is well-appearing, afebrile, nontoxic, nontachycardic.  Will empirically treat for yeast vaginitis given clinical presentation.  She was started on Diflucan 1 dose every 72 hours for total of 2 doses.  Will also use clotrimazole topically to help with symptoms.  Cervical vaginal swab was collected and is pending.  We will contact her for any intervention additional treatment based on lab results.  She is to wear loosefitting cotton underwear and use hypoallergenic soaps and detergents.  Testing for HIV and syphilis was obtained as well.  Recommended follow-up with OB/GYN if symptoms or not improving.  Strict return precautions given to which she expressed understanding.  Blood pressure was addressed during visit and recommended patient monitor this at home. She should follow up with PCP for ongoing management. Discussed alarm symptoms that would warrant emergent evaluation to which patient expressed understanding.   Final Clinical Impressions(s) / UC Diagnoses   Final diagnoses:  Acute vaginitis  Vaginal discharge  Routine screening for STI (sexually transmitted infection)  Elevated blood pressure reading in office with diagnosis of hypertension     Discharge Instructions      Take Diflucan today and a second dose in 3 days if symptoms persist.  Use clotrimazole topically to help with your symptoms.  Wear loosefitting cotton underwear and use hypoallergenic soaps and detergents.  We will contact you if need to arrange any additional treatment.  If you develop any pelvic pain, abdominal pain, fever, nausea, vomiting you need to be seen immediately.  Your blood pressure was elevated.  I believe this is because you are not feeling well.  Please avoid NSAIDs (aspirin,  ibuprofen/Advil, naproxen/Aleve), decongestants, caffeine, sodium.  Monitor your blood pressure at home.  Take your medication as prescribed.  If persistently above 140/90 you should return to consider medications.  If you develop any chest pain, shortness of breath, headache, vision change, dizziness in the setting of high blood pressure you need to go to the emergency room.      ED Prescriptions     Medication Sig Dispense Auth. Provider   fluconazole (DIFLUCAN) 150 MG tablet Take 1 tablet (150 mg total) by mouth every 3 (three) days as needed. 2 tablet Shebra Muldrow K, PA-C   clotrimazole (CLOTRIMAZOLE 3) 2 % vaginal cream Place 1 Applicatorful vaginally at bedtime. 21 g Auston Halfmann K, PA-C      PDMP not reviewed this encounter.   Terrilee Croak, PA-C 06/15/22 1012

## 2022-06-16 LAB — RPR: RPR Ser Ql: NONREACTIVE

## 2022-06-18 LAB — CERVICOVAGINAL ANCILLARY ONLY
Bacterial Vaginitis (gardnerella): NEGATIVE
Candida Glabrata: NEGATIVE
Candida Vaginitis: POSITIVE — AB
Chlamydia: NEGATIVE
Comment: NEGATIVE
Comment: NEGATIVE
Comment: NEGATIVE
Comment: NEGATIVE
Comment: NEGATIVE
Comment: NORMAL
Neisseria Gonorrhea: NEGATIVE
Trichomonas: NEGATIVE

## 2022-09-28 ENCOUNTER — Encounter (HOSPITAL_COMMUNITY): Payer: Self-pay

## 2022-09-28 ENCOUNTER — Ambulatory Visit (HOSPITAL_COMMUNITY)
Admission: EM | Admit: 2022-09-28 | Discharge: 2022-09-28 | Disposition: A | Discharge status: Home / Self Care | Payer: 59 | Attending: Family Medicine | Admitting: Family Medicine

## 2022-09-28 DIAGNOSIS — N760 Acute vaginitis: Secondary | ICD-10-CM | POA: Diagnosis present

## 2022-09-28 DIAGNOSIS — Z202 Contact with and (suspected) exposure to infections with a predominantly sexual mode of transmission: Secondary | ICD-10-CM | POA: Insufficient documentation

## 2022-09-28 HISTORY — DX: Polycystic ovarian syndrome: E28.2

## 2022-09-28 MED ORDER — FLUCONAZOLE 150 MG PO TABS
150.0000 mg | ORAL_TABLET | ORAL | 0 refills | Status: AC
Start: 2022-09-28 — End: 2022-10-02

## 2022-09-28 NOTE — Discharge Instructions (Signed)
Staff will notify you if there is anything positive on any of the swabs.  Take fluconazole 150 mg--1 tablet every 3 days for 2 doses

## 2022-09-28 NOTE — ED Provider Notes (Signed)
[facility]    CSN: [id number] Arrival date & time: 09/28/22  0806      History   Chief Complaint Chief Complaint  Patient presents with   Vaginal Discharge    HPI Rebecca Moody is a 47 y.o. female.    Vaginal Discharge  Here for some vaginal irritation and discharge.  It began about 5 days ago and she is also been spotting in that time.  She does have an IUD  No fever or abdominal pain.  She is felt some dryness in her mouth and throat but no sore throat or cough or congestion.  She requests STD testing over oropharynx, rectum, and vaginal area.    Past Medical History:  Diagnosis Date   AMA (advanced maternal age) multigravida 35+    H/O varicella    Hypertension    PCOS (polycystic ovarian syndrome)     Patient Active Problem List   Diagnosis Date Noted   Accelerated hypertension 12/23/2013   Normal pregnancy 09/27/2011    History reviewed. No pertinent surgical history.  OB History     Gravida  3   Para  2   Term  2   Preterm  0   AB  1   Living  2      SAB  1   IAB  0   Ectopic  0   Multiple  0   Live Births  2            Home Medications    Prior to Admission medications   Medication Sig Start Date End Date Taking? Authorizing Provider  amLODipine (NORVASC) 2.5 MG tablet TAKE 1 TABLET(2.5 MG) BY MOUTH DAILY Patient taking differently: 5 mg daily 04/21/16  Yes Jeffery, Chelle, PA  fluconazole (DIFLUCAN) 150 MG tablet Take 1 tablet (150 mg total) by mouth every 3 (three) days for 2 doses. 09/28/22 10/02/22 Yes Zenia Resides, MD  levonorgestrel (MIRENA) 20 MCG/DAY IUD 1 each by Intrauterine route once.   Yes [provider]  phentermine (ADIPEX-P) 37.5 MG tablet Take 1 tablet by mouth daily. 08/31/22  Yes [provider]  telmisartan-hydrochlorothiazide (MICARDIS HCT) 40-12.5 MG tablet Take 1 tablet by mouth daily. 08/31/22  Yes [provider]  dicyclomine (BENTYL) 20 MG tablet Take 1  tablet (20 mg total) by mouth 4 (four) times daily. 07/07/18 10/17/18  Janace Aris, NP    Family History Family History  Problem Relation Age of Onset   Diabetes Father    Stroke Father    Healthy Mother     Social History Social History   Tobacco Use   Smoking status: Some Days    Types: Cigars   Smokeless tobacco: Never  Vaping Use   Vaping Use: Never used  Substance Use Topics   Alcohol use: Not Currently    Comment: occasionally   Drug use: Not Currently    Types: Marijuana     Allergies   Patient has no known allergies.   Review of Systems Review of Systems  Genitourinary:  Positive for vaginal discharge.     Physical Exam Triage Vital Signs ED Triage Vitals  Enc Vitals Group     BP 09/28/22 0840 (!) 144/97     Pulse Rate 09/28/22 0840 76     Resp 09/28/22 0840 18     Temp 09/28/22 0840 98.7 F (37.1 C)     Temp Source 09/28/22 0840 Oral     SpO2 09/28/22 0840 96 %  Weight 09/28/22 0840 183 lb (83 kg)     Height 09/28/22 0840 5\' 6"  (1.676 m)     Head Circumference --      Peak Flow --      Pain Score 09/28/22 0838 5     Pain Loc --      Pain Edu? --      Excl. in [place]? --    No data found.  Updated Vital Signs BP (!) 144/97 (BP Location: Left Arm)   Pulse 76   Temp 98.7 F (37.1 C) (Oral)   Resp 18   Ht 5\' 6"  (1.676 m)   Wt 83 kg   LMP 09/28/2022 (Approximate)   SpO2 96%   BMI 29.54 kg/m   Visual Acuity Right Eye Distance:   Left Eye Distance:   Bilateral Distance:    Right Eye Near:   Left Eye Near:    Bilateral Near:     Physical Exam Vitals reviewed.  Constitutional:      General: She is not in acute distress.    Appearance: She is not ill-appearing, toxic-appearing or diaphoretic.  HENT:     Mouth/Throat:     Mouth: Mucous membranes are moist.     Pharynx: No oropharyngeal exudate or posterior oropharyngeal erythema.  Eyes:     Extraocular Movements: Extraocular movements intact.     Conjunctiva/sclera:  Conjunctivae normal.     Pupils: Pupils are equal, round, and reactive to light.  Cardiovascular:     Rate and Rhythm: Normal rate and regular rhythm.     Heart sounds: No murmur heard. Pulmonary:     Effort: Pulmonary effort is normal.     Breath sounds: Normal breath sounds.  Abdominal:     Palpations: Abdomen is soft.     Tenderness: There is no abdominal tenderness.  Genitourinary:    Comments: External female normal.  External rectum is normal.  Chaperone was present during the GU part of the exam. Musculoskeletal:     Cervical back: Neck supple.  Lymphadenopathy:     Cervical: No cervical adenopathy.  Skin:    Coloration: Skin is not jaundiced or pale.  Neurological:     General: No focal deficit present.     Mental Status: She is alert and oriented to person, place, and time.  Psychiatric:        Behavior: Behavior normal.      UC Treatments / Results  Labs (all labs ordered are listed, but only abnormal results are displayed) Labs Reviewed  CYTOLOGY, (ORAL, ANAL, URETHRAL) ANCILLARY ONLY  CERVICOVAGINAL ANCILLARY ONLY  CYTOLOGY, (ORAL, ANAL, URETHRAL) ANCILLARY ONLY    EKG   Radiology No results found.  Procedures Procedures (including critical care time)  Medications Ordered in UC Medications - No data to display  Initial Impression / Assessment and Plan / UC Course  I have reviewed the triage vital signs and the nursing notes.  Pertinent labs & imaging results that were available during my care of the patient were reviewed by me and considered in my medical decision making (see chart for details).        Swab was done of the oropharynx, at the vaginal area, and of the rectum.  We will notify her of any positives on the swab and treatment protocol.  She had HIV and RPR testing done in March which were both negative.  Diflucan is sent in empirically. Final Clinical Impressions(s) / UC Diagnoses   Final diagnoses:  Acute vaginitis  Exposure  to STD     Discharge Instructions      Staff will notify you if there is anything positive on any of the swabs.  Take fluconazole 150 mg--1 tablet every 3 days for 2 doses      ED Prescriptions     Medication Sig Dispense Auth. Provider   fluconazole (DIFLUCAN) 150 MG tablet Take 1 tablet (150 mg total) by mouth every 3 (three) days for 2 doses. 2 tablet Marlinda Mike Janace Aris, MD      PDMP not reviewed this encounter.   Zenia Resides, MD 09/28/22 [phone]

## 2022-09-28 NOTE — ED Triage Notes (Signed)
Patient having vaginal discomfort. States it feels like yeast. Patient having spotting and slight discharge. Onset 5 days ago.

## 2022-10-01 ENCOUNTER — Telehealth (HOSPITAL_COMMUNITY): Payer: Self-pay | Admitting: Emergency Medicine

## 2022-10-01 LAB — CERVICOVAGINAL ANCILLARY ONLY
Bacterial Vaginitis (gardnerella): POSITIVE — AB
Candida Glabrata: NEGATIVE
Candida Vaginitis: NEGATIVE
Chlamydia: NEGATIVE
Comment: NEGATIVE
Comment: NEGATIVE
Comment: NEGATIVE
Comment: NEGATIVE
Comment: NEGATIVE
Comment: NORMAL
Neisseria Gonorrhea: NEGATIVE
Trichomonas: NEGATIVE

## 2022-10-01 LAB — CYTOLOGY, (ORAL, ANAL, URETHRAL) ANCILLARY ONLY
Chlamydia: NEGATIVE
Comment: NEGATIVE
Comment: NORMAL
Neisseria Gonorrhea: NEGATIVE

## 2022-10-01 MED ORDER — METRONIDAZOLE 500 MG PO TABS: 500.0000 mg | ORAL_TABLET | Freq: Two times a day (BID) | ORAL | 0 refills | Status: DC

## 2022-10-03 LAB — CYTOLOGY, (ORAL, ANAL, URETHRAL) ANCILLARY ONLY
Chlamydia: NEGATIVE
Comment: NEGATIVE
Comment: NEGATIVE
Comment: NORMAL
Neisseria Gonorrhea: NEGATIVE
Trichomonas: NEGATIVE

## 2023-03-21 ENCOUNTER — Encounter (HOSPITAL_COMMUNITY): Payer: Self-pay

## 2023-03-21 ENCOUNTER — Ambulatory Visit (HOSPITAL_COMMUNITY)
Admission: RE | Admit: 2023-03-21 | Discharge: 2023-03-21 | Disposition: A | Discharge status: Home / Self Care | Payer: 59 | Source: Ambulatory Visit | Attending: Family Medicine | Admitting: Family Medicine

## 2023-03-21 VITALS — BP 172/116 | HR 68 | Temp 98.1°F | Resp 18

## 2023-03-21 DIAGNOSIS — Z113 Encounter for screening for infections with a predominantly sexual mode of transmission: Secondary | ICD-10-CM | POA: Diagnosis present

## 2023-03-21 DIAGNOSIS — N898 Other specified noninflammatory disorders of vagina: Secondary | ICD-10-CM

## 2023-03-21 LAB — HIV ANTIBODY (ROUTINE TESTING W REFLEX): HIV Screen 4th Generation wRfx: NONREACTIVE

## 2023-03-21 MED ORDER — METRONIDAZOLE 500 MG PO TABS: 500.0000 mg | ORAL_TABLET | Freq: Two times a day (BID) | ORAL | 0 refills | Status: AC

## 2023-03-21 NOTE — ED Triage Notes (Signed)
Pt states she would like STI swabs done orally and vaginally. She is having some vaginal discharge and itching X 2 days. She isnt having any oral sx but would like to be swabbed since she had oral sex.

## 2023-03-21 NOTE — Discharge Instructions (Signed)
You were seen today for vaginal discharge and STD screening.  Based on your symptoms I will treat for possible bacterial vaginosis.  Your swabs and lab work will be resulted tomorrow and if anything is positive we will call to notify you.

## 2023-03-21 NOTE — ED Provider Notes (Signed)
[facility]    CSN: [id number] Arrival date & time: 03/21/23  1054      History   Chief Complaint Chief Complaint  Patient presents with   Vaginal Discharge    Entered by patient   SEXUALLY TRANSMITTED DISEASE    HPI Rebecca Moody is a 47 y.o. female.    Vaginal Discharge  Patient is here for vaginal discharge x 3 days.  White, milky.  No itching noted.  She does have h/o BV and this seems similar.  She did have unprotected intercourse recently.  No known exposures but would like to be testing both vaginally and orally.  She would also like blood work done.       Past Medical History:  Diagnosis Date   AMA (advanced maternal age) multigravida 35+    H/O varicella    Hypertension    PCOS (polycystic ovarian syndrome)     Patient Active Problem List   Diagnosis Date Noted   Accelerated hypertension 12/23/2013   Normal pregnancy 09/27/2011    History reviewed. No pertinent surgical history.  OB History     Gravida  3   Para  2   Term  2   Preterm  0   AB  1   Living  2      SAB  1   IAB  0   Ectopic  0   Multiple  0   Live Births  2            Home Medications    Prior to Admission medications   Medication Sig Start Date End Date Taking? Authorizing Provider  metroNIDAZOLE (FLAGYL) 500 MG tablet Take 1 tablet (500 mg total) by mouth 2 (two) times daily for 7 days. 03/21/23 03/28/23 Yes Rodgers Likes, MD  telmisartan-hydrochlorothiazide (MICARDIS HCT) 40-12.5 MG tablet Take 1 tablet by mouth daily. 08/31/22  Yes [provider]  amLODipine (NORVASC) 2.5 MG tablet TAKE 1 TABLET(2.5 MG) BY MOUTH DAILY Patient taking differently: 5 mg daily 04/21/16   Porfirio Oar, PA  levonorgestrel (MIRENA) 20 MCG/DAY IUD 1 each by Intrauterine route once.    [provider]  phentermine (ADIPEX-P) 37.5 MG tablet Take 1 tablet by mouth daily. 08/31/22   [provider]  dicyclomine (BENTYL) 20 MG tablet Take 1  tablet (20 mg total) by mouth 4 (four) times daily. 07/07/18 10/17/18  Janace Aris, NP    Family History Family History  Problem Relation Age of Onset   Diabetes Father    Stroke Father    Healthy Mother     Social History Social History   Tobacco Use   Smoking status: Some Days    Types: Cigars   Smokeless tobacco: Never  Vaping Use   Vaping status: Never Used  Substance Use Topics   Alcohol use: Not Currently    Comment: occasionally   Drug use: Not Currently    Types: Marijuana     Allergies   Patient has no known allergies.   Review of Systems Review of Systems  Constitutional: Negative.   HENT: Negative.    Respiratory: Negative.    Cardiovascular: Negative.   Gastrointestinal: Negative.   Genitourinary:  Positive for vaginal discharge.  Musculoskeletal: Negative.   Psychiatric/Behavioral: Negative.       Physical Exam Triage Vital Signs ED Triage Vitals  Encounter Vitals Group     BP 03/21/23 1105 (!) 172/116     Systolic BP Percentile --  Diastolic BP Percentile --      Pulse Rate 03/21/23 1105 68     Resp 03/21/23 1105 18     Temp 03/21/23 1105 98.1 F (36.7 C)     Temp Source 03/21/23 1105 Oral     SpO2 03/21/23 1105 98 %     Weight --      Height --      Head Circumference --      Peak Flow --      Pain Score 03/21/23 1103 0     Pain Loc --      Pain Education --      Exclude from Growth Chart --    No data found.  Updated Vital Signs BP (!) 172/116 (BP Location: Right Arm)   Pulse 68   Temp 98.1 F (36.7 C) (Oral)   Resp 18   SpO2 98%   Visual Acuity Right Eye Distance:   Left Eye Distance:   Bilateral Distance:    Right Eye Near:   Left Eye Near:    Bilateral Near:     Physical Exam Constitutional:      Appearance: Normal appearance.  Cardiovascular:     Rate and Rhythm: Normal rate and regular rhythm.  Pulmonary:     Effort: Pulmonary effort is normal.     Breath sounds: Normal breath sounds.  Neurological:      General: No focal deficit present.     Mental Status: She is alert.      UC Treatments / Results  Labs (all labs ordered are listed, but only abnormal results are displayed) Labs Reviewed  RPR  HIV ANTIBODY (ROUTINE TESTING W REFLEX)  CERVICOVAGINAL ANCILLARY ONLY  CYTOLOGY, (ORAL, ANAL, URETHRAL) ANCILLARY ONLY    EKG   Radiology No results found.  Procedures Procedures (including critical care time)  Medications Ordered in UC Medications - No data to display  Initial Impression / Assessment and Plan / UC Course  I have reviewed the triage vital signs and the nursing notes.  Pertinent labs & imaging results that were available during my care of the patient were reviewed by me and considered in my medical decision making (see chart for details).    Final Clinical Impressions(s) / UC Diagnoses   Final diagnoses:  Vaginal discharge  Screening for STD (sexually transmitted disease)     Discharge Instructions      You were seen today for vaginal discharge and STD screening.  Based on your symptoms I will treat for possible bacterial vaginosis.  Your swabs and lab work will be resulted tomorrow and if anything is positive we will call to notify you.     ED Prescriptions     Medication Sig Dispense Auth. Provider   metroNIDAZOLE (FLAGYL) 500 MG tablet Take 1 tablet (500 mg total) by mouth 2 (two) times daily for 7 days. 14 tablet Jannifer Franklin, MD      PDMP not reviewed this encounter.   Jannifer Franklin, MD 03/21/23 1123

## 2023-03-22 LAB — CERVICOVAGINAL ANCILLARY ONLY
Bacterial Vaginitis (gardnerella): NEGATIVE
Candida Glabrata: NEGATIVE
Candida Vaginitis: NEGATIVE
Chlamydia: NEGATIVE
Comment: NEGATIVE
Comment: NEGATIVE
Comment: NEGATIVE
Comment: NEGATIVE
Comment: NEGATIVE
Comment: NORMAL
Neisseria Gonorrhea: NEGATIVE
Trichomonas: NEGATIVE

## 2023-03-22 LAB — CYTOLOGY, (ORAL, ANAL, URETHRAL) ANCILLARY ONLY
Chlamydia: NEGATIVE
Comment: NEGATIVE
Comment: NORMAL
Neisseria Gonorrhea: NEGATIVE

## 2023-03-22 LAB — RPR: RPR Ser Ql: NONREACTIVE

## 2023-05-21 ENCOUNTER — Encounter (HOSPITAL_COMMUNITY): Payer: Self-pay

## 2023-05-21 ENCOUNTER — Ambulatory Visit (HOSPITAL_COMMUNITY)
Admission: RE | Admit: 2023-05-21 | Discharge: 2023-05-21 | Disposition: A | Discharge status: Home / Self Care | Payer: 59 | Source: Ambulatory Visit | Attending: Family Medicine | Admitting: Family Medicine

## 2023-05-21 VITALS — BP 153/96 | HR 75 | Temp 98.3°F | Resp 18

## 2023-05-21 DIAGNOSIS — N898 Other specified noninflammatory disorders of vagina: Secondary | ICD-10-CM | POA: Diagnosis not present

## 2023-05-21 DIAGNOSIS — Z113 Encounter for screening for infections with a predominantly sexual mode of transmission: Secondary | ICD-10-CM | POA: Diagnosis present

## 2023-05-21 NOTE — Discharge Instructions (Signed)
Your results will come back over the next few days and someone will call if results are positive and require treatment.  Return here as needed.

## 2023-05-21 NOTE — ED Triage Notes (Signed)
Pt requesting STD testing. C/o vaginal irritation since Friday. States has had unprotected intercourse.

## 2023-05-21 NOTE — ED Provider Notes (Signed)
[facility]    CSN: [id number] Arrival date & time: 05/21/23  1611      History   Chief Complaint Chief Complaint  Patient presents with   SEXUALLY TRANSMITTED DISEASE    Entered by patient    HPI Rebecca Moody is a 48 y.o. female.   Patient presents with vaginal irritation and discharge that began on Friday after having unprotected sexual intercourse.  Denies abdominal pain, back pain, fever, and vaginal bleeding.     Past Medical History:  Diagnosis Date   AMA (advanced maternal age) multigravida 35+    H/O varicella    Hypertension    PCOS (polycystic ovarian syndrome)     Patient Active Problem List   Diagnosis Date Noted   Accelerated hypertension 12/23/2013   Normal pregnancy 09/27/2011    History reviewed. No pertinent surgical history.  OB History     Gravida  3   Para  2   Term  2   Preterm  0   AB  1   Living  2      SAB  1   IAB  0   Ectopic  0   Multiple  0   Live Births  2            Home Medications    Prior to Admission medications   Medication Sig Start Date End Date Taking? Authorizing Provider  amLODipine (NORVASC) 2.5 MG tablet TAKE 1 TABLET(2.5 MG) BY MOUTH DAILY Patient taking differently: 5 mg daily 04/21/16   Porfirio Oar, PA  levonorgestrel (MIRENA) 20 MCG/DAY IUD 1 each by Intrauterine route once.    [provider]  phentermine (ADIPEX-P) 37.5 MG tablet Take 1 tablet by mouth daily. 08/31/22   [provider]  telmisartan (MICARDIS) 20 MG tablet Take by mouth. 10/09/22 05/21/23  [provider]  telmisartan-hydrochlorothiazide (MICARDIS HCT) 40-12.5 MG tablet Take 1 tablet by mouth daily. 08/31/22   [provider]  dicyclomine (BENTYL) 20 MG tablet Take 1 tablet (20 mg total) by mouth 4 (four) times daily. 07/07/18 10/17/18  Janace Aris, FNP    Family History Family History  Problem Relation Age of Onset   Diabetes Father    Stroke Father    Healthy  Mother     Social History Social History   Tobacco Use   Smoking status: Some Days    Types: Cigars   Smokeless tobacco: Never  Vaping Use   Vaping status: Never Used  Substance Use Topics   Alcohol use: Not Currently    Comment: occasionally   Drug use: Not Currently    Types: Marijuana     Allergies   Patient has no known allergies.   Review of Systems Review of Systems  Per HPI  Physical Exam Triage Vital Signs ED Triage Vitals [05/21/23 1623]  Encounter Vitals Group     BP (!) 153/96     Systolic BP Percentile      Diastolic BP Percentile      Pulse Rate 75     Resp 18     Temp 98.3 F (36.8 C)     Temp Source Oral     SpO2 95 %     Weight      Height      Head Circumference      Peak Flow      Pain Score 0     Pain Loc      Pain Education  Exclude from Growth Chart    No data found.  Updated Vital Signs BP (!) 153/96 (BP Location: Left Arm)   Pulse 75   Temp 98.3 F (36.8 C) (Oral)   Resp 18   SpO2 95%   Visual Acuity Right Eye Distance:   Left Eye Distance:   Bilateral Distance:    Right Eye Near:   Left Eye Near:    Bilateral Near:     Physical Exam Vitals and nursing note reviewed.  Constitutional:      General: She is awake. She is not in acute distress.    Appearance: Normal appearance. She is well-developed and well-groomed. She is not ill-appearing.  Abdominal:     Tenderness: There is no abdominal tenderness. There is no right CVA tenderness or left CVA tenderness.  Musculoskeletal:        General: Normal range of motion.  Skin:    General: Skin is warm and dry.  Neurological:     Mental Status: She is alert.  Psychiatric:        Behavior: Behavior is cooperative.      UC Treatments / Results  Labs (all labs ordered are listed, but only abnormal results are displayed) Labs Reviewed  CERVICOVAGINAL ANCILLARY ONLY    EKG   Radiology No results found.  Procedures Procedures (including critical care  time)  Medications Ordered in UC Medications - No data to display  Initial Impression / Assessment and Plan / UC Course  I have reviewed the triage vital signs and the nursing notes.  Pertinent labs & imaging results that were available during my care of the patient were reviewed by me and considered in my medical decision making (see chart for details).     Patient presented with vaginal irritation and discharge that began on Friday after having unprotected sexual intercourse.  Denies any other symptoms.  No significant findings upon assessment.  Nontender upon palpation to abdomen.  GU exam deferred.  Patient perform self swab for STD/STI.  Declines HIV and syphilis testing.  Discussed follow-up and return precautions. Final Clinical Impressions(s) / UC Diagnoses   Final diagnoses:  Vaginal discharge  Screening for STD (sexually transmitted disease)     Discharge Instructions      Your results will come back over the next few days and someone will call if results are positive and require treatment.  Return here as needed.     ED Prescriptions   None    PDMP not reviewed this encounter.   Wynonia Lawman A, NP 05/21/23 1710

## 2023-05-23 LAB — CERVICOVAGINAL ANCILLARY ONLY
Bacterial Vaginitis (gardnerella): NEGATIVE
Candida Glabrata: NEGATIVE
Candida Vaginitis: NEGATIVE
Chlamydia: NEGATIVE
Comment: NEGATIVE
Comment: NEGATIVE
Comment: NEGATIVE
Comment: NEGATIVE
Comment: NEGATIVE
Comment: NORMAL
Neisseria Gonorrhea: NEGATIVE
Trichomonas: NEGATIVE

## 2023-10-29 ENCOUNTER — Ambulatory Visit (HOSPITAL_COMMUNITY)
Admission: EM | Admit: 2023-10-29 | Discharge: 2023-10-29 | Disposition: A | Discharge status: Home / Self Care | Attending: Family Medicine | Admitting: Family Medicine

## 2023-10-29 ENCOUNTER — Encounter (HOSPITAL_COMMUNITY): Payer: Self-pay

## 2023-10-29 DIAGNOSIS — N898 Other specified noninflammatory disorders of vagina: Secondary | ICD-10-CM | POA: Insufficient documentation

## 2023-10-29 LAB — HIV ANTIBODY (ROUTINE TESTING W REFLEX): HIV Screen 4th Generation wRfx: NONREACTIVE

## 2023-10-29 MED ORDER — FLUCONAZOLE 150 MG PO TABS: 150.0000 mg | ORAL_TABLET | Freq: Once | ORAL | 0 refills | Status: AC

## 2023-10-29 NOTE — Discharge Instructions (Addendum)
 Take diflucan  once. Then, after 3 days (72hrs), if your discharge has not improved, take a second dose.  We will let you know if any other results come back positive.  Please seek medical attention if you have any worsening symptoms, bleeding, or fevers

## 2023-10-29 NOTE — ED Provider Notes (Signed)
 [facility]    CSN: [id number] Arrival date & time: 10/29/23  9076      History   Chief Complaint Chief Complaint  Patient presents with   Exposure to STD    HPI Rebecca Moody is a 48 y.o. female.   Here for STD testing Endorses white vaginal discharge and itching x 1 week Endorses both oral and vaginal sex Denies new partners Has had a yeast infection in the past and her discharge feels similar to this  Denies any fevers, dysuria, hematuria, vaginal bleeding, abdominal pain    Exposure to STD    Past Medical History:  Diagnosis Date   AMA (advanced maternal age) multigravida 35+    H/O varicella    Hypertension    PCOS (polycystic ovarian syndrome)     Patient Active Problem List   Diagnosis Date Noted   Accelerated hypertension 12/23/2013   Normal pregnancy 09/27/2011    History reviewed. No pertinent surgical history.  OB History     Gravida  3   Para  2   Term  2   Preterm  0   AB  1   Living  2      SAB  1   IAB  0   Ectopic  0   Multiple  0   Live Births  2            Home Medications    Prior to Admission medications   Medication Sig Start Date End Date Taking? Authorizing Provider  fluconazole  (DIFLUCAN ) 150 MG tablet Take 1 tablet (150 mg total) by mouth once for 1 dose. Repeat in 3 days if not better. 10/29/23 10/29/23 Yes Romelle Booty, MD  amLODipine  (NORVASC ) 2.5 MG tablet TAKE 1 TABLET(2.5 MG) BY MOUTH DAILY Patient taking differently: 5 mg daily 04/21/16   Jeffery, Chelle, PA  levonorgestrel (MIRENA) 20 MCG/DAY IUD 1 each by Intrauterine route once.    [provider]  phentermine (ADIPEX-P) 37.5 MG tablet Take 1 tablet by mouth daily. 08/31/22   [provider]  telmisartan (MICARDIS) 20 MG tablet Take by mouth. 10/09/22 05/21/23  [provider]  telmisartan-hydrochlorothiazide  (MICARDIS HCT) 40-12.5 MG tablet Take 1 tablet by mouth daily. 08/31/22   [provider]   dicyclomine  (BENTYL ) 20 MG tablet Take 1 tablet (20 mg total) by mouth 4 (four) times daily. 07/07/18 10/17/18  Adah Wilbert LABOR, FNP    Family History Family History  Problem Relation Age of Onset   Diabetes Father    Stroke Father    Healthy Mother     Social History Social History   Tobacco Use   Smoking status: Some Days    Types: Cigars   Smokeless tobacco: Never  Vaping Use   Vaping status: Never Used  Substance Use Topics   Alcohol use: Not Currently    Comment: occasionally   Drug use: Not Currently    Types: Marijuana     Allergies   Patient has no known allergies.   Review of Systems Review of Systems Per HPI   Physical Exam Triage Vital Signs ED Triage Vitals  Encounter Vitals Group     BP      Girls Systolic BP Percentile      Girls Diastolic BP Percentile      Boys Systolic BP Percentile      Boys Diastolic BP Percentile      Pulse      Resp      Temp  Temp src      SpO2      Weight      Height      Head Circumference      Peak Flow      Pain Score      Pain Loc      Pain Education      Exclude from Growth Chart    No data found.  Updated Vital Signs BP (!) 141/93 (BP Location: Left Arm)   Pulse 71   Temp 98.4 F (36.9 C) (Oral)   Resp 16   SpO2 96%   Visual Acuity Right Eye Distance:   Left Eye Distance:   Bilateral Distance:    Right Eye Near:   Left Eye Near:    Bilateral Near:     Physical Exam Constitutional:      Appearance: Normal appearance.  Pulmonary:     Effort: Pulmonary effort is normal. No respiratory distress.  Abdominal:     General: Abdomen is flat. There is no distension.     Palpations: Abdomen is soft.     Tenderness: There is no abdominal tenderness.  Musculoskeletal:        General: No swelling or deformity.  Skin:    General: Skin is warm and dry.     Findings: No rash.  Neurological:     General: No focal deficit present.     Mental Status: She is alert. Mental status is at baseline.   Psychiatric:        Mood and Affect: Mood normal.      UC Treatments / Results  Labs (all labs ordered are listed, but only abnormal results are displayed) Labs Reviewed  HIV ANTIBODY (ROUTINE TESTING W REFLEX)  RPR  CERVICOVAGINAL ANCILLARY ONLY  CYTOLOGY, (ORAL, ANAL, URETHRAL) ANCILLARY ONLY    EKG   Radiology No results found.  Procedures Procedures (including critical care time)  Medications Ordered in UC Medications - No data to display  Initial Impression / Assessment and Plan / UC Course  I have reviewed the triage vital signs and the nursing notes.  Pertinent labs & imaging results that were available during my care of the patient were reviewed by me and considered in my medical decision making (see chart for details).     48 year old female here for STD testing with history of recent vaginal discharge and itching Given similarity to previous yeast infections, treat empirically with Diflucan   Tested with vaginal and oropharyngeal self swab and HIV/RPR Patient will be notified of any positive results  Patient is afebrile today, benign abdominal exam, pelvic exam deferred.  Discussed return precautions   Final Clinical Impressions(s) / UC Diagnoses   Final diagnoses:  Vaginal discharge     Discharge Instructions      Take diflucan  once. Then, after 3 days (72hrs), if your discharge has not improved, take a second dose.  We will let you know if any other results come back positive.  Please seek medical attention if you have any worsening symptoms, bleeding, or fevers     ED Prescriptions     Medication Sig Dispense Auth. Provider   fluconazole  (DIFLUCAN ) 150 MG tablet Take 1 tablet (150 mg total) by mouth once for 1 dose. Repeat in 3 days if not better. 2 tablet Romelle Booty, MD      PDMP not reviewed this encounter.   Romelle Booty, MD 10/29/23 1027

## 2023-10-29 NOTE — ED Triage Notes (Signed)
 Pt states vaginal discharge and itching for the past week.  States she would like to be tested for STD's.

## 2023-10-30 LAB — CERVICOVAGINAL ANCILLARY ONLY
Bacterial Vaginitis (gardnerella): POSITIVE — AB
Candida Glabrata: NEGATIVE
Candida Vaginitis: NEGATIVE
Chlamydia: NEGATIVE
Comment: NEGATIVE
Comment: NEGATIVE
Comment: NEGATIVE
Comment: NEGATIVE
Comment: NEGATIVE
Comment: NORMAL
Neisseria Gonorrhea: NEGATIVE
Trichomonas: NEGATIVE

## 2023-10-30 LAB — RPR: RPR Ser Ql: NONREACTIVE

## 2023-10-30 LAB — CYTOLOGY, (ORAL, ANAL, URETHRAL) ANCILLARY ONLY
Chlamydia: NEGATIVE
Comment: NEGATIVE
Comment: NORMAL
Neisseria Gonorrhea: NEGATIVE

## 2023-10-31 ENCOUNTER — Ambulatory Visit (HOSPITAL_COMMUNITY): Payer: Self-pay

## 2023-10-31 MED ORDER — METRONIDAZOLE 500 MG PO TABS: 500.0000 mg | ORAL_TABLET | Freq: Two times a day (BID) | ORAL | 0 refills | Status: AC

## 2023-11-11 ENCOUNTER — Encounter: Payer: Self-pay | Admitting: Physician Assistant

## 2023-11-11 ENCOUNTER — Encounter: Payer: Self-pay | Admitting: Radiology

## 2023-11-11 ENCOUNTER — Ambulatory Visit (INDEPENDENT_AMBULATORY_CARE_PROVIDER_SITE_OTHER): Admitting: Physician Assistant

## 2023-11-11 ENCOUNTER — Other Ambulatory Visit (INDEPENDENT_AMBULATORY_CARE_PROVIDER_SITE_OTHER): Payer: Self-pay

## 2023-11-11 DIAGNOSIS — M25562 Pain in left knee: Secondary | ICD-10-CM

## 2023-11-11 DIAGNOSIS — M1712 Unilateral primary osteoarthritis, left knee: Secondary | ICD-10-CM | POA: Diagnosis not present

## 2023-11-11 DIAGNOSIS — G8929 Other chronic pain: Secondary | ICD-10-CM

## 2023-11-11 MED ORDER — METHYLPREDNISOLONE ACETATE 40 MG/ML IJ SUSP
40.0000 mg | INTRAMUSCULAR | Status: AC | PRN
  Administered 2023-11-11: 40 mg via INTRA_ARTICULAR

## 2023-11-11 MED ORDER — LIDOCAINE HCL 1 % IJ SOLN
5.0000 mL | INTRAMUSCULAR | Status: AC | PRN
  Administered 2023-11-11: 5 mL

## 2023-11-11 NOTE — Progress Notes (Signed)
 Office Visit Note   Patient: Rebecca Moody           Date of Birth: April 29, 1975           MRN: [id number] Visit Date: 11/11/2023              Requested by: Benjamine Aland, MD [address] [place],  [place] [address] PCP: Benjamine Aland, MD   Assessment & Plan: Visit Diagnoses:  1. Chronic pain of left knee   2. Primary osteoarthritis of left knee     Plan: She will work on quad strengthening exercises as discussed.  Also discussed knee friendly exercises.  Follow-up with us  in 2 weeks see what type of response she had to the injection.  Questions were encouraged and answered at length  Follow-Up Instructions: Return in 2 weeks (on 11/25/2023), or if symptoms worsen or fail to improve.   Orders:  Orders Placed This Encounter  Procedures   Large Joint Inj: L knee   XR Knee 1-2 Views Left   No orders of the defined types were placed in this encounter.     Procedures: Large Joint Inj: L knee on 11/11/2023 9:54 AM Indications: pain Details: 22 G 1.5 in needle, superolateral approach  Arthrogram: No  Medications: 5 mL lidocaine  1 %; 40 mg methylPREDNISolone  acetate 40 MG/ML Aspirate: 12 mL yellow Outcome: tolerated well, no immediate complications Procedure, treatment alternatives, risks and benefits explained, specific risks discussed. Consent was given by the patient. Immediately prior to procedure a time out was called to verify the correct patient, procedure, equipment, support staff and site/side marked as required. Patient was prepped and draped in the usual sterile fashion.       Clinical Data: No additional findings.   Subjective: Chief Complaint  Patient presents with   Left Knee - Pain    HPI Patient is 48 year old female with left knee pain.  She was last seen in our office on 06/30/2019 and at that time was given a viscosupplementation injection in her left knee.  She states she has had continued pain in the knee since last visit.  She is seen at the [facility] in  the last year she did have what sounds like a corticosteroid injection in the knee with no relief relief.  She is scheduled for an MRI in the coming days on the left knee.  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  Notes the knee now mostly has pain in the medial aspect.  She also endorses mechanical symptoms for locking and giving way.  She states therapy has helped some particularly TENS unit.  She has tried a knee brace which causes her pain becomes worse. Review of Systems No fevers or chills.  Nondiabetic  Objective: Vital Signs: There were no vitals taken for this visit.  Physical Exam Constitutional:      Appearance: She is not ill-appearing or diaphoretic.  Pulmonary:     Effort: Pulmonary effort is normal.  Neurological:     Mental Status: She is alert and oriented to person, place, and time.  Psychiatric:        Mood and Affect: Mood normal.     Ortho Exam Bilateral knees: Good range of motion of both knees.  No abnormal warmth or erythema of either knee.  Left knee with slight effusion.  No instability valgus varus stressing of either knee.  Patellofemoral crepitus left knee with tenderness along medial joint line.  No crepitus  in the right knee or tenderness throughout.  Specialty Comments:  No specialty comments available.  Imaging: XR Knee 1-2 Views Left Result Date: 11/11/2023 Left knee 2 views: Knee is well located.  Bone-on-bone medial compartment.  Mild patellofemoral changes and lateral compartmental changes with periarticular spurring.  Otherwise no acute findings.  No bony lesions.    PMFS History: Patient Active Problem List   Diagnosis Date Noted   Accelerated hypertension 12/23/2013   Normal pregnancy 09/27/2011   Past Medical History:  Diagnosis Date   AMA (advanced maternal age) multigravida 35+    H/O varicella    Hypertension    PCOS (polycystic ovarian syndrome)     Family History  Problem Relation Age of Onset    Diabetes Father    Stroke Father    Healthy Mother     No past surgical history on file. Social History   Occupational History   Not on file  Tobacco Use   Smoking status: Some Days    Types: Cigars   Smokeless tobacco: Never  Vaping Use   Vaping status: Never Used  Substance and Sexual Activity   Alcohol use: Not Currently    Comment: occasionally   Drug use: Not Currently    Types: Marijuana   Sexual activity: Yes    Birth control/protection: I.U.D.

## 2024-02-10 ENCOUNTER — Encounter: Payer: Self-pay | Admitting: Radiology

## 2024-03-29 ENCOUNTER — Encounter (HOSPITAL_COMMUNITY): Payer: Self-pay

## 2024-03-29 ENCOUNTER — Ambulatory Visit (HOSPITAL_COMMUNITY)
Admission: EM | Admit: 2024-03-29 | Discharge: 2024-03-29 | Disposition: A | Discharge status: Home / Self Care | Attending: Family Medicine | Admitting: Family Medicine

## 2024-03-29 DIAGNOSIS — J069 Acute upper respiratory infection, unspecified: Secondary | ICD-10-CM | POA: Diagnosis not present

## 2024-03-29 DIAGNOSIS — R059 Cough, unspecified: Secondary | ICD-10-CM

## 2024-03-29 LAB — POC COVID19/FLU A&B COMBO
Covid Antigen, POC: NEGATIVE
Influenza A Antigen, POC: NEGATIVE
Influenza B Antigen, POC: NEGATIVE

## 2024-03-29 MED ORDER — ACETAMINOPHEN 325 MG PO TABS
650.0000 mg | ORAL_TABLET | Freq: Once | ORAL | Status: AC
  Administered 2024-03-29: 650 mg via ORAL

## 2024-03-29 MED ORDER — IBUPROFEN 200 MG PO TABS: 400.0000 mg | ORAL_TABLET | Freq: Once | ORAL | Status: DC

## 2024-03-29 MED ORDER — ACETAMINOPHEN 325 MG PO TABS
ORAL_TABLET | ORAL | Status: AC
  Filled 2024-03-29: qty 2

## 2024-03-29 MED ORDER — OSELTAMIVIR PHOSPHATE 75 MG PO CAPS: 75.0000 mg | ORAL_CAPSULE | Freq: Two times a day (BID) | ORAL | 0 refills | Status: AC

## 2024-03-29 NOTE — ED Triage Notes (Addendum)
 Pt has c/o body aches, cough, headache, fever, chills that started last night into this morning. Pt states that relatives tested positive for the flu. Last dose of medication was 9 am.

## 2024-03-29 NOTE — ED Provider Notes (Signed)
 " [facility]    CSN: [id number] Arrival date & time: 03/29/24  1228      History   Chief Complaint Chief Complaint  Patient presents with   Cough    HPI Rebecca Moody is a 48 y.o. female.   The history is provided by the patient. No language interpreter was used.  Cough Cough characteristics:  Productive Sputum characteristics: Mucus color. Duration:  1 day (Sher woke up this morning with coughing) Timing:  Constant Progression:  Worsening Chronicity:  New Smoker: no   Context: sick contacts   Context comment:  Daughter had a recent diagnosis of flu Relieved by:  Nothing Worsened by:  Nothing Associated symptoms: fever, headaches and sore throat   Associated symptoms: no ear fullness, no ear pain and no shortness of breath   Fever:    Duration:  1 day   Timing:  Constant   Max temp PTA:  Did not measure temp at home. She had chills and cold   Past Medical History:  Diagnosis Date   AMA (advanced maternal age) multigravida 35+    H/O varicella    Hypertension    PCOS (polycystic ovarian syndrome)     Patient Active Problem List   Diagnosis Date Noted   Accelerated hypertension 12/23/2013   Normal pregnancy 09/27/2011    History reviewed. No pertinent surgical history.  OB History     Gravida  3   Para  2   Term  2   Preterm  0   AB  1   Living  2      SAB  1   IAB  0   Ectopic  0   Multiple  0   Live Births  2            Home Medications    Prior to Admission medications  Medication Sig Start Date End Date Taking? Authorizing Provider  amLODipine  (NORVASC ) 2.5 MG tablet TAKE 1 TABLET(2.5 MG) BY MOUTH DAILY Patient taking differently: 5 mg daily 04/21/16   Jeffery, Chelle, PA  levonorgestrel (MIRENA) 20 MCG/DAY IUD 1 each by Intrauterine route once.    [provider]  telmisartan (MICARDIS) 20 MG tablet Take by mouth. 10/09/22 11/11/23  [provider]  telmisartan-hydrochlorothiazide  (MICARDIS  HCT) 40-12.5 MG tablet Take 1 tablet by mouth daily. 08/31/22   [provider]  dicyclomine  (BENTYL ) 20 MG tablet Take 1 tablet (20 mg total) by mouth 4 (four) times daily. 07/07/18 10/17/18  Adah Wilbert LABOR, FNP    Family History Family History  Problem Relation Age of Onset   Diabetes Father    Stroke Father    Healthy Mother     Social History Social History[1]   Allergies   Patient has no known allergies.   Review of Systems Review of Systems  Constitutional:  Positive for fever.  HENT:  Positive for sore throat. Negative for ear pain.   Respiratory:  Positive for cough. Negative for shortness of breath.   Neurological:  Positive for headaches.     Physical Exam Triage Vital Signs ED Triage Vitals  Encounter Vitals Group     BP      Girls Systolic BP Percentile      Girls Diastolic BP Percentile      Boys Systolic BP Percentile      Boys Diastolic BP Percentile      Pulse      Resp      Temp  Temp src      SpO2      Weight      Height      Head Circumference      Peak Flow      Pain Score      Pain Loc      Pain Education      Exclude from Growth Chart    No data found.  Updated Vital Signs BP 130/86 (BP Location: Right Arm)   Pulse 96   Temp (!) 101.1 F (38.4 C) (Oral)   Resp 17   SpO2 96%   Visual Acuity Right Eye Distance:   Left Eye Distance:   Bilateral Distance:    Right Eye Near:   Left Eye Near:    Bilateral Near:     Physical Exam Vitals and nursing note reviewed.  Constitutional:      General: She is not in acute distress.    Appearance: Normal appearance.  HENT:     Right Ear: Tympanic membrane normal.     Left Ear: Tympanic membrane normal.     Nose: Nose normal.  Eyes:     Conjunctiva/sclera: Conjunctivae normal.  Cardiovascular:     Rate and Rhythm: Normal rate and regular rhythm.     Heart sounds: No murmur heard. Pulmonary:     Effort: Pulmonary effort is normal. No respiratory distress.     Breath  sounds: Normal breath sounds. No stridor. No wheezing or rhonchi.  Neurological:     Mental Status: She is alert.      UC Treatments / Results  Labs (all labs ordered are listed, but only abnormal results are displayed) Labs Reviewed  POC COVID19/FLU A&B COMBO    EKG   Radiology No results found.  Procedures Procedures (including critical care time)  Medications Ordered in UC Medications  acetaminophen  (TYLENOL ) tablet 650 mg (650 mg Oral Given 03/29/24 1448)    Initial Impression / Assessment and Plan / UC Course  I have reviewed the triage vital signs and the nursing notes.  Pertinent labs & imaging results that were available during my care of the patient were reviewed by me and considered in my medical decision making (see chart for details).  Clinical Course as of 03/29/24 1516  Sun Mar 29, 2024  1516 Viral illness with fever Due to a simple viral illness vs flu given hx of confirmed exposure and fever Tylenol  was given for fever during this visit COVID-19 and influenza tests are both negative Options given to do nothing and repeat test vs treat empirically given hx of exposure and symptoms. She opted to be treated for the influenza virus Tamiflu  escribed. [KE]    Clinical Course User Index [KE] Anders Otto DASEN, MD     Final Clinical Impressions(s) / UC Diagnoses   Final diagnoses:  Cough, unspecified type  Viral URI with cough     Discharge Instructions      It was nice seeing you today. I am sorry about your symptoms. Thankfully, your covid and flu tests are negative. Please use OTC tylenol  or Ibuprofen  as needed for fever or pain. Keep well hydrated. See PCP soon if there is no improvement.      ED Prescriptions   None    PDMP not reviewed this encounter.     [1]  Social History Tobacco Use   Smoking status: Some Days    Types: Cigars   Smokeless tobacco: Never  Vaping Use   Vaping status: Never Used  Substance Use Topics    Alcohol use: Not Currently    Comment: occasionally   Drug use: Not Currently    Types: Marijuana     Anders Otto DASEN, MD 03/29/24 1516  "

## 2024-03-29 NOTE — Discharge Instructions (Addendum)
 It was nice seeing you today. I am sorry about your symptoms. Thankfully, your COVID and flu tests are negative. However, this could be a false negative since your symptoms started less than 24 hrs ago and you have a hx of confirmed exposure. Per our conversation, you prefer to be treated for flu. Medication sent to your pharmacy. Please use OTC Tylenol  or Ibuprofen  as needed for fever or pain. Keep well hydrated. See PCP soon if there is no improvement.

## 2024-04-12 ENCOUNTER — Encounter (HOSPITAL_COMMUNITY): Payer: Self-pay

## 2024-04-12 ENCOUNTER — Ambulatory Visit (HOSPITAL_COMMUNITY)
Admission: RE | Admit: 2024-04-12 | Discharge: 2024-04-12 | Disposition: A | Discharge status: Home / Self Care | Payer: Self-pay | Source: Ambulatory Visit | Attending: Emergency Medicine | Admitting: Emergency Medicine

## 2024-04-12 VITALS — BP 131/86 | HR 72 | Temp 98.6°F | Resp 16

## 2024-04-12 DIAGNOSIS — Z113 Encounter for screening for infections with a predominantly sexual mode of transmission: Secondary | ICD-10-CM | POA: Insufficient documentation

## 2024-04-12 LAB — HIV ANTIBODY (ROUTINE TESTING W REFLEX): HIV Screen 4th Generation wRfx: NONREACTIVE

## 2024-04-12 NOTE — Discharge Instructions (Addendum)
 Today you have been screened for sexually transmitted infections.  Results would be available over the next few days and staff will contact if any treatment is needed.  Abstain from intercourse until all results have been received.  Return to clinic for any new or urgent symptoms.

## 2024-04-12 NOTE — ED Triage Notes (Signed)
 Pt wants STI cyto and blood work. Denies any sx.

## 2024-04-12 NOTE — ED Provider Notes (Signed)
 " [facility]    CSN: [id number] Arrival date & time: 04/12/24  1033      History   Chief Complaint Chief Complaint  Patient presents with   SEXUALLY TRANSMITTED DISEASE    Entered by patient    HPI Rebecca Moody is a 49 y.o. female.   Patient presents to clinic requesting screening for sexually transmitted infection.  She has had increased vaginal discharge for perhaps the past 2 days or so.  Most recent unprotected intercourse was around 2 weeks ago and has not had any new sexual partners.  Does not have abdominal pain, vaginal itching, malodorous discharge or urinary symptoms.  Has an IUD.  Would like HIV and syphilis screening.  The history is provided by the patient and medical records.    Past Medical History:  Diagnosis Date   AMA (advanced maternal age) multigravida 35+    H/O varicella    Hypertension    PCOS (polycystic ovarian syndrome)     Patient Active Problem List   Diagnosis Date Noted   Accelerated hypertension 12/23/2013   Normal pregnancy 09/27/2011    History reviewed. No pertinent surgical history.  OB History     Gravida  3   Para  2   Term  2   Preterm  0   AB  1   Living  2      SAB  1   IAB  0   Ectopic  0   Multiple  0   Live Births  2            Home Medications    Prior to Admission medications  Medication Sig Start Date End Date Taking? Authorizing Provider  amLODipine  (NORVASC ) 2.5 MG tablet TAKE 1 TABLET(2.5 MG) BY MOUTH DAILY Patient taking differently: 5 mg daily 04/21/16  Yes Jeffery, Chelle, PA  levonorgestrel (MIRENA) 20 MCG/DAY IUD 1 each by Intrauterine route once.   Yes [provider]  telmisartan-hydrochlorothiazide  (MICARDIS HCT) 40-12.5 MG tablet Take 1 tablet by mouth daily. 08/31/22  Yes [provider]  telmisartan (MICARDIS) 20 MG tablet Take by mouth. 10/09/22 11/11/23  [provider]  dicyclomine  (BENTYL ) 20 MG tablet Take 1 tablet (20 mg total) by mouth 4  (four) times daily. 07/07/18 10/17/18  Adah Wilbert LABOR, FNP    Family History Family History  Problem Relation Age of Onset   Diabetes Father    Stroke Father    Healthy Mother     Social History Social History[1]   Allergies   Patient has no known allergies.   Review of Systems Review of Systems  Per HPI  Physical Exam Triage Vital Signs ED Triage Vitals  Encounter Vitals Group     BP 04/12/24 1041 131/86     Girls Systolic BP Percentile --      Girls Diastolic BP Percentile --      Boys Systolic BP Percentile --      Boys Diastolic BP Percentile --      Pulse Rate 04/12/24 1041 72     Resp 04/12/24 1041 16     Temp 04/12/24 1041 98.6 F (37 C)     Temp Source 04/12/24 1041 Oral     SpO2 04/12/24 1041 94 %     Weight --      Height --      Head Circumference --      Peak Flow --      Pain Score 04/12/24 1040 0  Pain Loc --      Pain Education --      Exclude from Growth Chart --    No data found.  Updated Vital Signs BP 131/86 (BP Location: Right Arm)   Pulse 72   Temp 98.6 F (37 C) (Oral)   Resp 16   SpO2 94%   Visual Acuity Right Eye Distance:   Left Eye Distance:   Bilateral Distance:    Right Eye Near:   Left Eye Near:    Bilateral Near:     Physical Exam Vitals and nursing note reviewed.  Constitutional:      Appearance: Normal appearance.  HENT:     Head: Normocephalic and atraumatic.     Right Ear: External ear normal.     Left Ear: External ear normal.     Nose: Nose normal.     Mouth/Throat:     Mouth: Mucous membranes are moist.  Eyes:     Conjunctiva/sclera: Conjunctivae normal.  Cardiovascular:     Rate and Rhythm: Normal rate.  Pulmonary:     Effort: Pulmonary effort is normal. No respiratory distress.  Skin:    General: Skin is warm and dry.  Neurological:     General: No focal deficit present.     Mental Status: She is alert.  Psychiatric:        Mood and Affect: Mood normal.      UC Treatments / Results   Labs (all labs ordered are listed, but only abnormal results are displayed) Labs Reviewed  HIV ANTIBODY (ROUTINE TESTING W REFLEX)  SYPHILIS: RPR W/REFLEX TO RPR TITER AND TREPONEMAL ANTIBODIES, TRADITIONAL SCREENING AND DIAGNOSIS ALGORITHM  CERVICOVAGINAL ANCILLARY ONLY    EKG   Radiology No results found.  Procedures Procedures (including critical care time)  Medications Ordered in UC Medications - No data to display  Initial Impression / Assessment and Plan / UC Course  I have reviewed the triage vital signs and the nursing notes.  Pertinent labs & imaging results that were available during my care of the patient were reviewed by me and considered in my medical decision making (see chart for details).  Vitals and triage reviewed, patient is hemodynamically stable.  Requesting STI screening.  Increased vaginal discharge.  Cytology swab and labs obtained.  Staff will contact if results require treatment.  Plan of care, follow-up care and return precautions given, no questions at this time.    Final Clinical Impressions(s) / UC Diagnoses   Final diagnoses:  Encounter for screening examination for sexually transmitted infection     Discharge Instructions      Today you have been screened for sexually transmitted infections.  Results would be available over the next few days and staff will contact if any treatment is needed.  Abstain from intercourse until all results have been received.  Return to clinic for any new or urgent symptoms.    ED Prescriptions   None    PDMP not reviewed this encounter.    [1]  Social History Tobacco Use   Smoking status: Some Days    Types: Cigars   Smokeless tobacco: Never  Vaping Use   Vaping status: Never Used  Substance Use Topics   Alcohol use: Not Currently    Comment: occasionally   Drug use: Not Currently    Types: Marijuana     Dreama Ivery SAILOR, FNP 04/12/24 1059  "

## 2024-04-13 LAB — CERVICOVAGINAL ANCILLARY ONLY
Chlamydia: NEGATIVE
Comment: NEGATIVE
Comment: NEGATIVE
Comment: NORMAL
Neisseria Gonorrhea: NEGATIVE
Trichomonas: NEGATIVE

## 2024-04-13 LAB — SYPHILIS: RPR W/REFLEX TO RPR TITER AND TREPONEMAL ANTIBODIES, TRADITIONAL SCREENING AND DIAGNOSIS ALGORITHM: RPR Ser Ql: NONREACTIVE
